# Patient Record
Sex: Female | Born: 1995
Health system: Southern US, Community
[De-identification: ages and names within clinical notes are randomized; demographics above are authoritative.]

## PROBLEM LIST (undated history)

## (undated) DIAGNOSIS — E538 Deficiency of other specified B group vitamins: Secondary | ICD-10-CM

## (undated) DIAGNOSIS — F419 Anxiety disorder, unspecified: Secondary | ICD-10-CM

## (undated) DIAGNOSIS — N83209 Unspecified ovarian cyst, unspecified side: Secondary | ICD-10-CM

## (undated) DIAGNOSIS — E559 Vitamin D deficiency, unspecified: Secondary | ICD-10-CM

## (undated) DIAGNOSIS — Z789 Other specified health status: Secondary | ICD-10-CM

## (undated) HISTORY — DX: Unspecified ovarian cyst, unspecified side: N83.209

## (undated) HISTORY — PX: LIGAMENT REPAIR: SHX5444

## (undated) HISTORY — DX: Deficiency of other specified B group vitamins: E53.8

## (undated) HISTORY — PX: APPENDECTOMY: SHX54

## (undated) HISTORY — DX: Vitamin D deficiency, unspecified: E55.9

## (undated) HISTORY — DX: Anxiety disorder, unspecified: F41.9

---

## 2019-03-25 ENCOUNTER — Inpatient Hospital Stay (HOSPITAL_COMMUNITY)
Admission: AD | Admit: 2019-03-25 | Discharge: 2019-03-25 | Disposition: A | Discharge status: Home / Self Care | Payer: BC Managed Care – PPO | Attending: Obstetrics and Gynecology | Admitting: Obstetrics and Gynecology

## 2019-03-25 ENCOUNTER — Encounter (HOSPITAL_COMMUNITY): Payer: Self-pay

## 2019-03-25 ENCOUNTER — Inpatient Hospital Stay (HOSPITAL_COMMUNITY): Payer: BC Managed Care – PPO

## 2019-03-25 DIAGNOSIS — R109 Unspecified abdominal pain: Secondary | ICD-10-CM | POA: Diagnosis not present

## 2019-03-25 DIAGNOSIS — O99891 Other specified diseases and conditions complicating pregnancy: Secondary | ICD-10-CM | POA: Insufficient documentation

## 2019-03-25 DIAGNOSIS — O4691 Antepartum hemorrhage, unspecified, first trimester: Secondary | ICD-10-CM

## 2019-03-25 DIAGNOSIS — Z3A01 Less than 8 weeks gestation of pregnancy: Secondary | ICD-10-CM | POA: Insufficient documentation

## 2019-03-25 DIAGNOSIS — Z3491 Encounter for supervision of normal pregnancy, unspecified, first trimester: Secondary | ICD-10-CM

## 2019-03-25 DIAGNOSIS — O208 Other hemorrhage in early pregnancy: Secondary | ICD-10-CM | POA: Insufficient documentation

## 2019-03-25 DIAGNOSIS — O468X1 Other antepartum hemorrhage, first trimester: Secondary | ICD-10-CM

## 2019-03-25 DIAGNOSIS — O418X10 Other specified disorders of amniotic fluid and membranes, first trimester, not applicable or unspecified: Secondary | ICD-10-CM

## 2019-03-25 DIAGNOSIS — O4690 Antepartum hemorrhage, unspecified, unspecified trimester: Secondary | ICD-10-CM

## 2019-03-25 HISTORY — DX: Other specified health status: Z78.9

## 2019-03-25 LAB — CBC
HCT: 36.3 % (ref 36.0–46.0)
Hemoglobin: 12.2 g/dL (ref 12.0–15.0)
MCH: 29.2 pg (ref 26.0–34.0)
MCHC: 33.6 g/dL (ref 30.0–36.0)
MCV: 86.8 fL (ref 80.0–100.0)
Platelets: 280 10*3/uL (ref 150–400)
RBC: 4.18 MIL/uL (ref 3.87–5.11)
RDW: 12.2 % (ref 11.5–15.5)
WBC: 11.9 10*3/uL — ABNORMAL HIGH (ref 4.0–10.5)
nRBC: 0 % (ref 0.0–0.2)

## 2019-03-25 LAB — WET PREP, GENITAL
Sperm: NONE SEEN
Trich, Wet Prep: NONE SEEN
Yeast Wet Prep HPF POC: NONE SEEN

## 2019-03-25 LAB — ABO/RH: ABO/RH(D): B POS

## 2019-03-25 LAB — HCG, QUANTITATIVE, PREGNANCY: hCG, Beta Chain, Quant, S: 74349 m[IU]/mL — ABNORMAL HIGH (ref <5)

## 2019-03-25 NOTE — MAU Note (Signed)
Pt was transferred from [place]. She reports bleeding and sharp pain. Has been bleeding since yesterday. Has not gone through whole pad in hour. Her HCG was 247 on November 7th. Was sent here from [facility] in [facility] for repeat quant and U/S.

## 2019-03-25 NOTE — ED Notes (Signed)
Report given to nicole in [facility], transport in ED to take patient over to [facility] at this time

## 2019-03-25 NOTE — Discharge Instructions (Signed)
Subchorionic Hematoma ° °A subchorionic hematoma is a gathering of blood between the outer wall of the embryo (chorion) and the inner wall of the womb (uterus). °This condition can cause vaginal bleeding. If they cause little or no vaginal bleeding, early small hematomas usually shrink on their own and do not affect your baby or pregnancy. When bleeding starts later in pregnancy, or if the hematoma is larger or occurs in older pregnant women, the condition may be more serious. Larger hematomas may get bigger, which increases the chances of miscarriage. This condition also increases the risk of: °· Premature separation of the placenta from the uterus. °· Premature (preterm) labor. °· Stillbirth. °What are the causes? °The exact cause of this condition is not known. It occurs when blood is trapped between the placenta and the uterine wall because the placenta has separated from the original site of implantation. °What increases the risk? °You are more likely to develop this condition if: °· You were treated with fertility medicines. °· You conceived through in vitro fertilization (IVF). °What are the signs or symptoms? °Symptoms of this condition include: °· Vaginal spotting or bleeding. °· Contractions of the uterus. These cause abdominal pain. °Sometimes you may have no symptoms and the bleeding may only be seen when ultrasound images are taken (transvaginal ultrasound). °How is this diagnosed? °This condition is diagnosed based on a physical exam. This includes a pelvic exam. You may also have other tests, including: °· Blood tests. °· Urine tests. °· Ultrasound of the abdomen. °How is this treated? °Treatment for this condition can vary. Treatment may include: °· Watchful waiting. You will be monitored closely for any changes in bleeding. During this stage: °? The hematoma may be reabsorbed by the body. °? The hematoma may separate the fluid-filled space containing the embryo (gestational sac) from the wall of the  womb (endometrium). °· Medicines. °· Activity restriction. This may be needed until the bleeding stops. °Follow these instructions at home: °· Stay on bed rest if told to do so by your health care provider. °· Do not lift anything that is heavier than 10 lbs. (4.5 kg) or as told by your health care provider. °· Do not use any products that contain nicotine or tobacco, such as cigarettes and e-cigarettes. If you need help quitting, ask your health care provider. °· Track and write down the number of pads you use each day and how soaked (saturated) they are. °· Do not use tampons. °· Keep all follow-up visits as told by your health care provider. This is important. Your health care provider may ask you to have follow-up blood tests or ultrasound tests or both. °Contact a health care provider if: °· You have any vaginal bleeding. °· You have a fever. °Get help right away if: °· You have severe cramps in your stomach, back, abdomen, or pelvis. °· You pass large clots or tissue. Save any tissue for your health care provider to look at. °· You have more vaginal bleeding, and you faint or become lightheaded or weak. °Summary °· A subchorionic hematoma is a gathering of blood between the outer wall of the placenta and the uterus. °· This condition can cause vaginal bleeding. °· Sometimes you may have no symptoms and the bleeding may only be seen when ultrasound images are taken. °· Treatment may include watchful waiting, medicines, or activity restriction. °This information is not intended to replace advice given to you by your health care provider. Make sure you discuss any questions you   have with your health care provider. Document Released: 08/03/2006 Document Revised: 03/31/2017 Document Reviewed: 06/14/2016 Elsevier Patient Education  2020 [employer].      Constipation, Adult Constipation is when a person has fewer bowel movements in a week than normal, has difficulty having a bowel movement, or has stools  that are dry, hard, or larger than normal. Constipation may be caused by an underlying condition. It may become worse with age if a person takes certain medicines and does not take in enough fluids. Follow these instructions at home: Eating and drinking   Eat foods that have a lot of fiber, such as fresh fruits and vegetables, whole grains, and beans.  Limit foods that are high in fat, low in fiber, or overly processed, such as french fries, hamburgers, cookies, candies, and soda.  Drink enough fluid to keep your urine clear or pale yellow. General instructions  Exercise regularly or as told by your health care provider.  Go to the restroom when you have the urge to go. Do not hold it in.  Take over-the-counter and prescription medicines only as told by your health care provider. These include any fiber supplements.  Practice pelvic floor retraining exercises, such as deep breathing while relaxing the lower abdomen and pelvic floor relaxation during bowel movements.  Watch your condition for any changes.  Keep all follow-up visits as told by your health care provider. This is important. Contact a health care provider if:  You have pain that gets worse.  You have a fever.  You do not have a bowel movement after 4 days.  You vomit.  You are not hungry.  You lose weight.  You are bleeding from the anus.  You have thin, pencil-like stools. Get help right away if:  You have a fever and your symptoms suddenly get worse.  You leak stool or have blood in your stool.  Your abdomen is bloated.  You have severe pain in your abdomen.  You feel dizzy or you faint. This information is not intended to replace advice given to you by your health care provider. Make sure you discuss any questions you have with your health care provider. Document Released: 01/15/2004 Document Revised: 03/31/2017 Document Reviewed: 10/07/2015 Elsevier Patient Education  2020 [employer].     Safe Medications in Pregnancy   Acne: Benzoyl Peroxide Salicylic Acid  Backache/Headache: Tylenol: 2 regular strength every 4 hours OR              2 Extra strength every 6 hours  Colds/Coughs/Allergies: Benadryl (alcohol free) 25 mg every 6 hours as needed Breath right strips Claritin Cepacol throat lozenges Chloraseptic throat spray Cold-Eeze- up to three times per day Cough drops, alcohol free Flonase (by prescription only) Guaifenesin Mucinex Robitussin DM (plain only, alcohol free) Saline nasal spray/drops Sudafed (pseudoephedrine) & Actifed ** use only after [redacted] weeks gestation and if you do not have high blood pressure Tylenol Vicks Vaporub Zinc lozenges Zyrtec   Constipation: Colace Ducolax suppositories Fleet enema Glycerin suppositories Metamucil Milk of magnesia Miralax Senokot Smooth move tea  Diarrhea: Kaopectate Imodium A-D  *NO pepto Bismol  Hemorrhoids: Anusol Anusol HC Preparation H Tucks  Indigestion: Tums Maalox Mylanta Zantac  Pepcid  Insomnia: Benadryl (alcohol free) 25mg  every 6 hours as needed Tylenol PM Unisom, no Gelcaps  Leg Cramps: Tums MagGel  Nausea/Vomiting:  Bonine Dramamine Emetrol Ginger extract Sea bands Meclizine  Nausea medication to take during pregnancy:  Unisom (doxylamine succinate 25 mg tablets) Take one  tablet daily at bedtime. If symptoms are not adequately controlled, the dose can be increased to a maximum recommended dose of two tablets daily (1/2 tablet in the morning, 1/2 tablet mid-afternoon and one at bedtime). Vitamin B6 100mg  tablets. Take one tablet twice a day (up to 200 mg per day).  Skin Rashes: Aveeno products Benadryl cream or 25mg  every 6 hours as needed Calamine Lotion 1% cortisone cream  Yeast infection: Gyne-lotrimin 7 Monistat 7  Gum/tooth pain: Anbesol  **If taking multiple medications, please check labels to avoid duplicating the same active  ingredients **take medication as directed on the label ** Do not exceed 4000 mg of tylenol in 24 hours **Do not take medications that contain aspirin or ibuprofen

## 2019-03-25 NOTE — MAU Provider Note (Signed)
Chief Complaint: Vaginal Bleeding and Abdominal Pain   First Provider Initiated Contact with Patient 03/25/19 1548     SUBJECTIVE HPI: Whitney Castro is a 23 y.o. G1P0 at [redacted]w[redacted]d who presents to Maternity Admissions reporting vaginal bleeding and abdominal pain. Lives in [place]. Had an HCG on 11/7 that was 247 & is scheduled for follow up ultrasound on Wednesday (per patient & Care Everywhere).  Current symptoms started last night. Reports sharp pain & pressure in her RLQ. Feels pressure in her rectum like she needs to have a BM. Had a BM last night but symptoms didn't improve. Is not taking anything for constipation. Started having some vaginal bleeding last night after her BM. Bleeding was bright red but is now dark red/pink & has slowed down. Not saturating pads or passing clots. Some n/v. Denies fever/chills.   Location: RLQ abdomen Quality: sharp, pressure Severity: 3/10 on pain scale Duration: 1 day Timing: constant Modifying factors: worse with touch Associated signs and symptoms: vaginal bleeding, constipation  Past Medical History:  Diagnosis Date  . Medical history non-contributory    OB History  Gravida Para Term Preterm AB Living  1            SAB TAB Ectopic Multiple Live Births               # Outcome Date GA Lbr Len/2nd Weight Sex Delivery Anes PTL Lv  1 Current            Past Surgical History:  Procedure Laterality Date  . APPENDECTOMY    . LIGAMENT REPAIR     knee and elbow   Social History   Socioeconomic History  . Marital status: Single    Spouse name: Not on file  . Number of children: Not on file  . Years of education: Not on file  . Highest education level: Not on file  Occupational History  . Not on file  Social Needs  . Financial resource strain: Not on file  . Food insecurity    Worry: Not on file    Inability: Not on file  . Transportation needs    Medical: Not on file    Non-medical: Not on file  Tobacco Use  . Smoking status: Never  Smoker  . Smokeless tobacco: Never Used  Substance and Sexual Activity  . Alcohol use: Not Currently  . Drug use: Never  . Sexual activity: Not on file  Lifestyle  . Physical activity    Days per week: Not on file    Minutes per session: Not on file  . Stress: Not on file  Relationships  . Social Herbalist on phone: Not on file    Gets together: Not on file    Attends religious service: Not on file    Active member of club or organization: Not on file    Attends meetings of clubs or organizations: Not on file    Relationship status: Not on file  . Intimate partner violence    Fear of current or ex partner: Not on file    Emotionally abused: Not on file    Physically abused: Not on file    Forced sexual activity: Not on file  Other Topics Concern  . Not on file  Social History Narrative  . Not on file   History reviewed. No pertinent family history. No current facility-administered medications on file prior to encounter.    No current outpatient medications on file prior to encounter.  No Known Allergies  I have reviewed patient's Past Medical Hx, Surgical Hx, Family Hx, Social Hx, medications and allergies.   Review of Systems  Constitutional: Negative.   Gastrointestinal: Positive for abdominal pain, constipation, nausea, rectal pain and vomiting. Negative for abdominal distention, blood in stool and diarrhea.  Genitourinary: Positive for vaginal bleeding. Negative for vaginal discharge.    OBJECTIVE Patient Vitals for the past 24 hrs:  BP Temp Temp src Pulse Resp SpO2 Height Weight  03/25/19 1522 116/61 98.1 F (36.7 C) Oral 84 18 100 % 5\' 5"  (1.651 m) 76.4 kg  03/25/19 1415 125/63 98.6 F (37 C) Oral 81 17 99 % - -   Constitutional: Well-developed, well-nourished female in no acute distress.  Cardiovascular: normal rate & rhythm, no murmur Respiratory: normal rate and effort. Lung sounds clear throughout GI: Abd soft, non-tender, Pos BS x 4. No  guarding or rebound tenderness MS: Extremities nontender, no edema, normal ROM Neurologic: Alert and oriented x 4.  GU:     SPECULUM EXAM: NEFG, small amount of dark pink blood. No active bleeding.   BIMANUAL: No CMT. cervix closed; uterus normal size, right adnexal tenderness  LAB RESULTS Results for orders placed or performed during the hospital encounter of 03/25/19 (from the past 24 hour(s))  CBC     Status: Abnormal   Collection Time: 03/25/19  3:31 PM  Result Value Ref Range   WBC 11.9 (H) 4.0 - 10.5 K/uL   RBC 4.18 3.87 - 5.11 MIL/uL   Hemoglobin 12.2 12.0 - 15.0 g/dL   HCT 09.836.3 11.936.0 - 14.746.0 %   MCV 86.8 80.0 - 100.0 fL   MCH 29.2 26.0 - 34.0 pg   MCHC 33.6 30.0 - 36.0 g/dL   RDW 82.912.2 56.211.5 - 13.015.5 %   Platelets 280 150 - 400 K/uL   nRBC 0.0 0.0 - 0.2 %  hCG, quantitative, pregnancy     Status: Abnormal   Collection Time: 03/25/19  3:31 PM  Result Value Ref Range   hCG, Beta Chain, Quant, S 74,349 (H) <5 mIU/mL  ABO/Rh     Status: None   Collection Time: 03/25/19  3:31 PM  Result Value Ref Range   ABO/RH(D) B POS    No rh immune globuloin      NOT A RH IMMUNE GLOBULIN CANDIDATE, PT RH POSITIVE Performed at [facility], [address] [place], [place] [address]   Wet prep, genital     Status: Abnormal   Collection Time: 03/25/19  4:00 PM   Specimen: PATH Cytology Cervicovaginal Ancillary Only  Result Value Ref Range   Yeast Wet Prep HPF POC NONE SEEN NONE SEEN   Trich, Wet Prep NONE SEEN NONE SEEN   Clue Cells Wet Prep HPF POC PRESENT (A) NONE SEEN   WBC, Wet Prep HPF POC MODERATE (A) NONE SEEN   Sperm NONE SEEN     IMAGING Kore[place] Ob Less Than 14 Weeks With Ob Transvaginal  Result Date: 03/25/2019 CLINICAL DATA:  Abnormal vaginal bleeding.  Early pregnancy. EXAM: OBSTETRIC <14 WK ULTRASOUND TECHNIQUE: Transabdominal ultrasound was performed for evaluation of the gestation as well as the maternal uterus and adnexal regions. COMPARISON:  None. FINDINGS:  Intrauterine gestational sac: Single Yolk sac:  Yes Embryo:  Yes Cardiac Activity: Yes Heart Rate: 121 bpm CRL:   7.1 mm   6 w 4 d                  US EDC:  11/14/2019 Subchorionic hemorrhage:  Large subchorionic hemorrhage. Maternal uterus/adnexae: Normal ovaries.  No free fluid. IMPRESSION: Large subchorionic hemorrhage. Intrauterine pregnancy of approximately 6 weeks 4 days gestation. Electronically Signed   By: Francene Boyers M.D.   On: 03/25/2019 18:43    MAU COURSE Orders Placed This Encounter  Procedures  . Wet prep, genital  . US OB LESS THAN 14 WEEKS WITH OB TRANSVAGINAL  . CBC  . hCG, quantitative, pregnancy  . ABO/Rh  . Discharge patient   No orders of the defined types were placed in this encounter.   MDM +UPT UA, wet prep, GC/chlamydia, CBC, ABO/Rh, quant hCG, and [place] today to rule out ectopic pregnancy which is life threatening.   RH positive  Ultrasound shows live IUP with a large SCH. EDD updated.   ASSESSMENT 1. Subchorionic hematoma in first trimester, single or unspecified fetus   2. Vaginal bleeding in pregnancy   3. Normal IUP (intrauterine pregnancy) on prenatal ultrasound, first trimester     PLAN Discharge home in stable condition. Bleeding precautions Written info for constipation treatment given F/u with OB in [place]  Follow-up Information    [facility] Follow up.   Specialty: Obstetrics and Gynecology Why: return for worsening symptoms Contact information: [id number] [place] [address] [phone]         Allergies as of 03/25/2019   No Known Allergies     Medication List    You have not been prescribed any medications.      Judeth Horn, NP 03/25/2019  6:56 PM

## 2019-03-26 LAB — GC/CHLAMYDIA PROBE AMP (~~LOC~~) NOT AT ARMC
Chlamydia: NEGATIVE
Comment: NEGATIVE
Comment: NORMAL
Neisseria Gonorrhea: NEGATIVE

## 2019-11-05 ENCOUNTER — Other Ambulatory Visit: Payer: Self-pay

## 2019-11-05 ENCOUNTER — Ambulatory Visit (INDEPENDENT_AMBULATORY_CARE_PROVIDER_SITE_OTHER): Payer: Self-pay | Admitting: Pediatrics

## 2019-11-05 DIAGNOSIS — Z7681 Expectant parent(s) prebirth pediatrician visit: Secondary | ICD-10-CM

## 2019-11-05 NOTE — Progress Notes (Signed)
Prenatal counseling for impending newborn done. Prenatal care started at 6 weeks. Small VSD found at 20 weeks, rechecked at 28 weeks by ECHO and VSD resolved. Mom is GBS+, no other concerns.

## 2019-11-06 LAB — OB RESULTS CONSOLE HIV ANTIBODY (ROUTINE TESTING): HIV: NONREACTIVE

## 2019-11-06 LAB — OB RESULTS CONSOLE GC/CHLAMYDIA
Chlamydia: NEGATIVE
Gonorrhea: NEGATIVE

## 2019-11-06 LAB — OB RESULTS CONSOLE ABO/RH: RH Type: POSITIVE

## 2019-11-06 LAB — OB RESULTS CONSOLE RPR: RPR: NONREACTIVE

## 2019-11-06 LAB — OB RESULTS CONSOLE HEPATITIS B SURFACE ANTIGEN: Hepatitis B Surface Ag: NEGATIVE

## 2019-11-06 LAB — OB RESULTS CONSOLE ANTIBODY SCREEN: Antibody Screen: NEGATIVE

## 2019-11-06 LAB — OB RESULTS CONSOLE RUBELLA ANTIBODY, IGM: Rubella: IMMUNE

## 2019-11-06 LAB — OB RESULTS CONSOLE GBS: GBS: POSITIVE

## 2019-11-10 ENCOUNTER — Inpatient Hospital Stay (HOSPITAL_COMMUNITY)
Admission: AD | Admit: 2019-11-10 | Discharge: 2019-11-10 | Disposition: A | Discharge status: Home / Self Care | Payer: BC Managed Care – PPO | Attending: Obstetrics | Admitting: Obstetrics

## 2019-11-10 ENCOUNTER — Encounter (HOSPITAL_COMMUNITY): Payer: Self-pay | Admitting: Obstetrics and Gynecology

## 2019-11-10 ENCOUNTER — Other Ambulatory Visit: Payer: Self-pay

## 2019-11-10 DIAGNOSIS — Z3A40 40 weeks gestation of pregnancy: Secondary | ICD-10-CM | POA: Diagnosis not present

## 2019-11-10 DIAGNOSIS — Z3A39 39 weeks gestation of pregnancy: Secondary | ICD-10-CM

## 2019-11-10 DIAGNOSIS — O471 False labor at or after 37 completed weeks of gestation: Secondary | ICD-10-CM | POA: Diagnosis present

## 2019-11-10 DIAGNOSIS — Z3689 Encounter for other specified antenatal screening: Secondary | ICD-10-CM | POA: Diagnosis not present

## 2019-11-10 DIAGNOSIS — O480 Post-term pregnancy: Secondary | ICD-10-CM | POA: Insufficient documentation

## 2019-11-10 DIAGNOSIS — O479 False labor, unspecified: Secondary | ICD-10-CM

## 2019-11-10 NOTE — MAU Note (Signed)
pt reports she started having back pain around 7pm. Got stronger around 11:30pm took some tylenol no relief pain got sharper Denies any vag bleeding or leaking at this time. No dilation and last office visit.  fetal movement less since ctx started.

## 2019-11-10 NOTE — Discharge Instructions (Signed)
Braxton Hicks Contractions °Contractions of the uterus can occur throughout pregnancy, but they are not always a sign that you are in labor. You may have practice contractions called Braxton Hicks contractions. These false labor contractions are sometimes confused with true labor. °What are Braxton Hicks contractions? °Braxton Hicks contractions are tightening movements that occur in the muscles of the uterus before labor. Unlike true labor contractions, these contractions do not result in opening (dilation) and thinning of the cervix. Toward the end of pregnancy (32-34 weeks), Braxton Hicks contractions can happen more often and may become stronger. These contractions are sometimes difficult to tell apart from true labor because they can be very uncomfortable. You should not feel embarrassed if you go to the hospital with false labor. °Sometimes, the only way to tell if you are in true labor is for your health care provider to look for changes in the cervix. The health care provider will do a physical exam and may monitor your contractions. If you are not in true labor, the exam should show that your cervix is not dilating and your water has not broken. °If there are no other health problems associated with your pregnancy, it is completely safe for you to be sent home with false labor. You may continue to have Braxton Hicks contractions until you go into true labor. °How to tell the difference between true labor and false labor °True labor °· Contractions last 30-70 seconds. °· Contractions become very regular. °· Discomfort is usually felt in the top of the uterus, and it spreads to the lower abdomen and low back. °· Contractions do not go away with walking. °· Contractions usually become more intense and increase in frequency. °· The cervix dilates and gets thinner. °False labor °· Contractions are usually shorter and not as strong as true labor contractions. °· Contractions are usually irregular. °· Contractions  are often felt in the front of the lower abdomen and in the groin. °· Contractions may go away when you walk around or change positions while lying down. °· Contractions get weaker and are shorter-lasting as time goes on. °· The cervix usually does not dilate or become thin. °Follow these instructions at home: ° °· Take over-the-counter and prescription medicines only as told by your health care provider. °· Keep up with your usual exercises and follow other instructions from your health care provider. °· Eat and drink lightly if you think you are going into labor. °· If Braxton Hicks contractions are making you uncomfortable: °? Change your position from lying down or resting to walking, or change from walking to resting. °? Sit and rest in a tub of warm water. °? Drink enough fluid to keep your urine pale yellow. Dehydration may cause these contractions. °? Do slow and deep breathing several times an hour. °· Keep all follow-up prenatal visits as told by your health care provider. This is important. °Contact a health care provider if: °· You have a fever. °· You have continuous pain in your abdomen. °Get help right away if: °· Your contractions become stronger, more regular, and closer together. °· You have fluid leaking or gushing from your vagina. °· You pass blood-tinged mucus (bloody show). °· You have bleeding from your vagina. °· You have low back pain that you never had before. °· You feel your baby’s head pushing down and causing pelvic pressure. °· Your baby is not moving inside you as much as it used to. °Summary °· Contractions that occur before labor are   called Braxton Hicks contractions, false labor, or practice contractions. °· Braxton Hicks contractions are usually shorter, weaker, farther apart, and less regular than true labor contractions. True labor contractions usually become progressively stronger and regular, and they become more frequent. °· Manage discomfort from Braxton Hicks contractions  by changing position, resting in a warm bath, drinking plenty of water, or practicing deep breathing. °This information is not intended to replace advice given to you by your health care provider. Make sure you discuss any questions you have with your health care provider. °Document Revised: 03/31/2017 Document Reviewed: 09/01/2016 °Elsevier Patient Education © 2020 [employer] ° °

## 2019-11-10 NOTE — MAU Note (Addendum)
I have communicated with Venia Carbon, NP and reviewed vital signs:  Vitals:   11/10/19 0204 11/10/19 0344  BP: 124/76 119/69  Pulse: 73 85  Resp: 18   Temp: 98.3 F (36.8 C)     Vaginal exam:  Dilation: Closed Effacement (%): Thick Cervical Position: Posterior Presentation: Undeterminable Exam by:: Manuela Neptune, RN,   Also reviewed contraction pattern and that non-stress test is reactive.  It has been documented that patient is contracting irregularly with no cervical change over 1 hour not indicating active labor. Patient states contractions have significantly improved since she has been in MAU.  Patient denies any other complaints.  Based on this report provider has given order for discharge.  A discharge order and diagnosis entered by a provider.   Labor discharge instructions reviewed with patient.

## 2019-11-12 ENCOUNTER — Encounter (HOSPITAL_COMMUNITY): Payer: Self-pay | Admitting: *Deleted

## 2019-11-12 ENCOUNTER — Telehealth (HOSPITAL_COMMUNITY): Payer: Self-pay | Admitting: *Deleted

## 2019-11-12 NOTE — Telephone Encounter (Signed)
Preadmission screen  

## 2019-11-13 ENCOUNTER — Encounter (HOSPITAL_COMMUNITY): Payer: Self-pay | Admitting: *Deleted

## 2019-11-15 ENCOUNTER — Encounter (HOSPITAL_COMMUNITY): Payer: Self-pay | Admitting: Obstetrics and Gynecology

## 2019-11-15 ENCOUNTER — Inpatient Hospital Stay (EMERGENCY_DEPARTMENT_HOSPITAL)
Admission: AD | Admit: 2019-11-15 | Discharge: 2019-11-15 | Disposition: A | Discharge status: Home / Self Care | Payer: BC Managed Care – PPO | Source: Home / Self Care | Attending: Obstetrics and Gynecology | Admitting: Obstetrics and Gynecology

## 2019-11-15 ENCOUNTER — Other Ambulatory Visit: Payer: Self-pay

## 2019-11-15 DIAGNOSIS — O480 Post-term pregnancy: Secondary | ICD-10-CM | POA: Insufficient documentation

## 2019-11-15 DIAGNOSIS — B373 Candidiasis of vulva and vagina: Secondary | ICD-10-CM

## 2019-11-15 DIAGNOSIS — Z3689 Encounter for other specified antenatal screening: Secondary | ICD-10-CM

## 2019-11-15 DIAGNOSIS — Z3A40 40 weeks gestation of pregnancy: Secondary | ICD-10-CM | POA: Insufficient documentation

## 2019-11-15 DIAGNOSIS — Z0371 Encounter for suspected problem with amniotic cavity and membrane ruled out: Secondary | ICD-10-CM

## 2019-11-15 DIAGNOSIS — O98819 Other maternal infectious and parasitic diseases complicating pregnancy, unspecified trimester: Secondary | ICD-10-CM

## 2019-11-15 DIAGNOSIS — O99891 Other specified diseases and conditions complicating pregnancy: Secondary | ICD-10-CM | POA: Diagnosis not present

## 2019-11-15 LAB — WET PREP, GENITAL
Clue Cells Wet Prep HPF POC: NONE SEEN
Sperm: NONE SEEN
Trich, Wet Prep: NONE SEEN

## 2019-11-15 LAB — POCT FERN TEST: POCT Fern Test: NEGATIVE

## 2019-11-15 MED ORDER — TERCONAZOLE 0.8 % VA CREA: 1.0000 | TOPICAL_CREAM | Freq: Every day | VAGINAL | 0 refills | Status: DC

## 2019-11-15 NOTE — Discharge Instructions (Signed)

## 2019-11-15 NOTE — MAU Note (Signed)
PT SAYS AT 0400 TODAY- OUTSIDE WALKING DOG- FELT LARGE GUSH OF WATER- - CONTINUED THROUGH THE DAY . [facility] - WITH DR  Ernestina Penna -  VE - Thursday - CLOSED- VERTEX.  DENIES HSV AND MRSA.  GBS- POSITIVE

## 2019-11-15 NOTE — MAU Provider Note (Signed)
S: Ms. Whitney Castro is a 24 y.o. G1P0 at [redacted]w[redacted]d  who presents to MAU today complaining of leaking of fluid since 0400. She states that she had a large gush of fluid at 0400 and has since had to change her underwear 5x today.  Patient denies vaginal bleeding. She denies contractions. She reports normal fetal movement.    O: BP 123/76 (BP Location: Right Arm)   Pulse 87   Temp 98.5 F (36.9 C) (Oral)   Resp 20   Ht 5\' 4"  (1.626 m)   Wt 88.2 kg   LMP 02/13/2019   BMI 33.39 kg/m  GENERAL: Well-developed, well-nourished female in no acute distress.  HEAD: Normocephalic, atraumatic.  CHEST: Normal effort of breathing, regular heart rate ABDOMEN: Soft, nontender, gravid PELVIC: Normal external female genitalia. Vagina is pink and rugated. Curdy white watery discharge.  Scant pooling that is milky in color-likely d/t yeast. Cervix pink appears closed.  Questionable polp noted. No active leaking or leakage with valsalva maneuver. Fern Collected.  Cervical exam:   Deferred   Fetal Monitoring: FHT: 125 bpm, Mod Var, -Decels, +Accels Toco: Occasional  Results for orders placed or performed during the hospital encounter of 11/15/19 (from the past 24 hour(s))  Wet prep, genital     Status: Abnormal   Collection Time: 11/15/19  9:26 PM   Specimen: Vaginal  Result Value Ref Range   Yeast Wet Prep HPF POC PRESENT (A) NONE SEEN   Trich, Wet Prep NONE SEEN NONE SEEN   Clue Cells Wet Prep HPF POC NONE SEEN NONE SEEN   WBC, Wet Prep HPF POC MANY (A) NONE SEEN   Sperm NONE SEEN      A: SIUP at [redacted]w[redacted]d  Membranes intact  NST Reactive  P:  -Patient informed of vaginal exam findings and informed suspicious for yeast. -Fern collected, reviewed negative. -Wet prep collected and pending. -Will await results.    [redacted]w[redacted]d, CNM 11/15/2019 9:28 PM   Reassessment (10:18 PM) Candidiasis of vagina  -WP returns positive for yeast. -Patient informed of results and treatment of Terazol. -Patient  questions why she can't have Diflucan and informed that this provider does not prescribe that during pregnancy and prefers treatment at source. -Patient agreeable and without further questions or concerns. -Encouraged to call or return to MAU if symptoms worsen or with the onset of new symptoms. -Discharged to home in stable condition.  11/17/2019 MSN, CNM Advanced Practice Provider[facility]

## 2019-11-17 ENCOUNTER — Inpatient Hospital Stay (HOSPITAL_COMMUNITY)
Admission: AD | Admit: 2019-11-17 | Discharge: 2019-11-20 | DRG: 807 | Disposition: A | Discharge status: Home / Self Care | Payer: BC Managed Care – PPO | Attending: Obstetrics and Gynecology | Admitting: Obstetrics and Gynecology

## 2019-11-17 ENCOUNTER — Inpatient Hospital Stay (HOSPITAL_COMMUNITY): Payer: BC Managed Care – PPO | Admitting: Anesthesiology

## 2019-11-17 ENCOUNTER — Other Ambulatory Visit: Payer: Self-pay

## 2019-11-17 ENCOUNTER — Encounter (HOSPITAL_COMMUNITY): Payer: Self-pay | Admitting: Obstetrics and Gynecology

## 2019-11-17 DIAGNOSIS — O26893 Other specified pregnancy related conditions, third trimester: Secondary | ICD-10-CM | POA: Diagnosis present

## 2019-11-17 DIAGNOSIS — F419 Anxiety disorder, unspecified: Secondary | ICD-10-CM | POA: Diagnosis present

## 2019-11-17 DIAGNOSIS — Z3A40 40 weeks gestation of pregnancy: Secondary | ICD-10-CM | POA: Diagnosis not present

## 2019-11-17 DIAGNOSIS — O99824 Streptococcus B carrier state complicating childbirth: Secondary | ICD-10-CM | POA: Diagnosis present

## 2019-11-17 DIAGNOSIS — F329 Major depressive disorder, single episode, unspecified: Secondary | ICD-10-CM | POA: Diagnosis present

## 2019-11-17 DIAGNOSIS — O99344 Other mental disorders complicating childbirth: Secondary | ICD-10-CM | POA: Diagnosis present

## 2019-11-17 DIAGNOSIS — Z20822 Contact with and (suspected) exposure to covid-19: Secondary | ICD-10-CM | POA: Diagnosis present

## 2019-11-17 LAB — CBC
HCT: 38.2 % (ref 36.0–46.0)
Hemoglobin: 12.6 g/dL (ref 12.0–15.0)
MCH: 29.7 pg (ref 26.0–34.0)
MCHC: 33 g/dL (ref 30.0–36.0)
MCV: 90.1 fL (ref 80.0–100.0)
Platelets: 224 10*3/uL (ref 150–400)
RBC: 4.24 MIL/uL (ref 3.87–5.11)
RDW: 13.6 % (ref 11.5–15.5)
WBC: 13.3 10*3/uL — ABNORMAL HIGH (ref 4.0–10.5)
nRBC: 0 % (ref 0.0–0.2)

## 2019-11-17 LAB — TYPE AND SCREEN
ABO/RH(D): B POS
Antibody Screen: NEGATIVE

## 2019-11-17 LAB — SARS CORONAVIRUS 2 BY RT PCR (HOSPITAL ORDER, PERFORMED IN ~~LOC~~ HOSPITAL LAB): SARS Coronavirus 2: NEGATIVE

## 2019-11-17 LAB — POCT FERN TEST: POCT Fern Test: NEGATIVE

## 2019-11-17 MED ORDER — SOD CITRATE-CITRIC ACID 500-334 MG/5ML PO SOLN: 30.0000 mL | ORAL | Status: DC | PRN

## 2019-11-17 MED ORDER — FENTANYL CITRATE (PF) 100 MCG/2ML IJ SOLN
INTRAMUSCULAR | Status: AC
  Filled 2019-11-17: qty 2

## 2019-11-17 MED ORDER — BUTORPHANOL TARTRATE 1 MG/ML IJ SOLN
INTRAMUSCULAR | Status: AC
  Filled 2019-11-17: qty 2

## 2019-11-17 MED ORDER — BUTORPHANOL TARTRATE 1 MG/ML IJ SOLN
2.0000 mg | INTRAMUSCULAR | Status: DC | PRN
  Administered 2019-11-17: 2 mg via INTRAVENOUS

## 2019-11-17 MED ORDER — ONDANSETRON HCL 4 MG/2ML IJ SOLN
4.0000 mg | Freq: Four times a day (QID) | INTRAMUSCULAR | Status: DC | PRN
  Administered 2019-11-17: 4 mg via INTRAVENOUS
  Filled 2019-11-17: qty 2

## 2019-11-17 MED ORDER — SODIUM CHLORIDE (PF) 0.9 % IJ SOLN
INTRAMUSCULAR | Status: DC | PRN
  Administered 2019-11-17: 12 mL/h via EPIDURAL

## 2019-11-17 MED ORDER — OXYCODONE-ACETAMINOPHEN 5-325 MG PO TABS: 2.0000 | ORAL_TABLET | ORAL | Status: DC | PRN

## 2019-11-17 MED ORDER — LIDOCAINE HCL (PF) 1 % IJ SOLN: 30.0000 mL | INTRAMUSCULAR | Status: DC | PRN

## 2019-11-17 MED ORDER — LACTATED RINGERS IV SOLN
500.0000 mL | INTRAVENOUS | Status: DC | PRN
  Administered 2019-11-18: 500 mL via INTRAVENOUS

## 2019-11-17 MED ORDER — ACETAMINOPHEN 325 MG PO TABS: 650.0000 mg | ORAL_TABLET | ORAL | Status: DC | PRN

## 2019-11-17 MED ORDER — FENTANYL CITRATE (PF) 100 MCG/2ML IJ SOLN: 100.0000 ug | Freq: Once | INTRAMUSCULAR | Status: DC

## 2019-11-17 MED ORDER — PHENYLEPHRINE 40 MCG/ML (10ML) SYRINGE FOR IV PUSH (FOR BLOOD PRESSURE SUPPORT): 80.0000 ug | PREFILLED_SYRINGE | INTRAVENOUS | Status: DC | PRN

## 2019-11-17 MED ORDER — OXYTOCIN-SODIUM CHLORIDE 30-0.9 UT/500ML-% IV SOLN
2.5000 [IU]/h | INTRAVENOUS | Status: DC
  Administered 2019-11-18: 2.5 [IU]/h via INTRAVENOUS
  Filled 2019-11-17: qty 500

## 2019-11-17 MED ORDER — OXYCODONE-ACETAMINOPHEN 5-325 MG PO TABS: 1.0000 | ORAL_TABLET | ORAL | Status: DC | PRN

## 2019-11-17 MED ORDER — DIPHENHYDRAMINE HCL 50 MG/ML IJ SOLN: 12.5000 mg | INTRAMUSCULAR | Status: DC | PRN

## 2019-11-17 MED ORDER — PHENYLEPHRINE 40 MCG/ML (10ML) SYRINGE FOR IV PUSH (FOR BLOOD PRESSURE SUPPORT)
80.0000 ug | PREFILLED_SYRINGE | INTRAVENOUS | Status: DC | PRN
  Filled 2019-11-17: qty 10

## 2019-11-17 MED ORDER — EPHEDRINE 5 MG/ML INJ: 10.0000 mg | INTRAVENOUS | Status: DC | PRN

## 2019-11-17 MED ORDER — OXYTOCIN BOLUS FROM INFUSION
333.0000 mL | Freq: Once | INTRAVENOUS | Status: AC
  Administered 2019-11-18: 333 mL via INTRAVENOUS

## 2019-11-17 MED ORDER — LACTATED RINGERS IV SOLN: INTRAVENOUS | Status: DC

## 2019-11-17 MED ORDER — LACTATED RINGERS IV SOLN
500.0000 mL | Freq: Once | INTRAVENOUS | Status: AC
  Administered 2019-11-17: 500 mL via INTRAVENOUS

## 2019-11-17 MED ORDER — FLEET ENEMA 7-19 GM/118ML RE ENEM: 1.0000 | ENEMA | RECTAL | Status: DC | PRN

## 2019-11-17 MED ORDER — SODIUM CHLORIDE 0.9 % IV SOLN
5.0000 10*6.[IU] | Freq: Once | INTRAVENOUS | Status: AC
  Administered 2019-11-17: 5 10*6.[IU] via INTRAVENOUS
  Filled 2019-11-17: qty 5

## 2019-11-17 MED ORDER — LIDOCAINE HCL (PF) 1 % IJ SOLN
INTRAMUSCULAR | Status: DC | PRN
  Administered 2019-11-17: 12 mL via EPIDURAL

## 2019-11-17 MED ORDER — PENICILLIN G POT IN DEXTROSE 60000 UNIT/ML IV SOLN
3.0000 10*6.[IU] | INTRAVENOUS | Status: DC
  Administered 2019-11-17: 3 10*6.[IU] via INTRAVENOUS
  Filled 2019-11-17 (×2): qty 50

## 2019-11-17 MED ORDER — FENTANYL-BUPIVACAINE-NACL 0.5-0.125-0.9 MG/250ML-% EP SOLN
12.0000 mL/h | EPIDURAL | Status: DC | PRN
  Filled 2019-11-17: qty 250

## 2019-11-17 NOTE — H&P (Signed)
Whitney Castro is a 24 y.o. G1P0 at [redacted]w[redacted]d gestation presents for complaint of Contractions, regular and painful.  Also noted leaking but fern negative.  +FM; ctx more uncomfortable then when she first got here, no more leaking fluid  Antepartum course: S>d, efw 84% (8'7"); vsd noted then resolved at 28 wga; anxiety/depression - zoloft, stable [facility] since 38.6 wks, transfer of care  See complete pre-natal records  History OB History    Gravida  1   Para      Term      Preterm      AB      Living        SAB      TAB      Ectopic      Multiple      Live Births             Past Medical History:  Diagnosis Date  . Anxiety   . B12 deficiency   . Medical history non-contributory   . Ovarian cyst   . Vitamin D deficiency    Past Surgical History:  Procedure Laterality Date  . APPENDECTOMY    . LIGAMENT REPAIR     knee and elbow   Family History: family history is not on file. Social History:  reports that she has never smoked. She has never used smokeless tobacco. She reports previous alcohol use. She reports that she does not use drugs.  ROS: See above otherwise negative  Prenatal labs:  ABO, Rh: --/--/B POS (07/18 1745) Antibody: NEG (07/18 1745) Rubella: Immune (07/07 0000) RPR: Nonreactive (07/07 0000)  HBsAg: Negative (07/07 0000)  HIV:Non-reactive (07/07 0000)  GBS: Positive/-- (07/07 0000)  1 hr Glucola: Normal Genetic screening: Normal Anatomy US: vsd noted  Physical Exam:   Dilation: 1 Effacement (%): 50, 60 Station: -2 Exam by:: Marvel Plan RN  Blood pressure 117/66, pulse 73, temperature 98.3 F (36.8 C), temperature source Oral, resp. rate 16, last menstrual period 02/13/2019, SpO2 99 %. A&O x 3 HEENT: Normal Lungs: CTAB CV: RRR Abdominal: Soft, Non-tender, Gravid and Estimated fetal weight: 8 lbs - 8 1/2 lbs Lower Extremities: Non-edematous, Non-tender  Pelvic Exam:      Dilatation: 1cm-2cm     Effacement:  60%     Station: -3     Presentation: Cephalic, bag of membranes noted, too high for arom  Labs:  CBC:  Lab Results  Component Value Date   WBC 13.3 (H) 11/17/2019   RBC 4.24 11/17/2019   HGB 12.6 11/17/2019   HCT 38.2 11/17/2019   MCV 90.1 11/17/2019   MCH 29.7 11/17/2019   MCHC 33.0 11/17/2019   RDW 13.6 11/17/2019   PLT 224 11/17/2019   CMP: No results found for: NA, K, CL, CO2, GLUCOSE, BUN, CREATININE, CALCIUM, PROT, AST, ALT, ALBUMIN, ALKPHOS, BILITOT, GFRNONAA, GFRAA, ANIONGAP Urine: No results found for: COLORURINE, APPEARANCEUR, LABSPEC, PHURINE, GLUCOSEU, HGBUR, BILIRUBINUR, KETONESUR, PROTEINUR, NITRITE, LEUKOCYTESUR   Prenatal Transfer Tool  Maternal Diabetes: No Genetic Screening: Normal Maternal Ultrasounds/Referrals: Cardiac defect Fetal Ultrasounds or other Referrals:  Fetal echo - vsd noted, repeat at 28 wga wnl Maternal Substance Abuse:  none Significant Maternal Medications:  Meds include: Zoloft Significant Maternal Lab Results: Group B Strep positive  FHT: 130s, nml variability Toco: q 3-5 min  Assessment/Plan:  24 y.o. G1P0 at [redacted]w[redacted]d gestation   1. Labor - admit and plan svd; planning epidural, s/p 1 dose iv stadol had relief but wearing off, plan second  dose and will then likely be ready for epidural   2.   H/o fetal vsd, resolved; plan eval after delivery; FOB has child with h/o asd 3.  gbs pos - pcn for prophylaxis 4.  Depression/anxiety - zoloft, stable 5. Rh pos 6. RI 7. S>D, u/s with efw 8'7" (83%)  Vick Frees 11/17/2019, 7:33 PM

## 2019-11-17 NOTE — MAU Note (Signed)
Pt reports to mau with c/o ctx q 3 minutes since this morning.  Pt reports trickling of fluid with ctx upon arriving in mau. +FM

## 2019-11-17 NOTE — Anesthesia Procedure Notes (Addendum)
Epidural Patient location during procedure: OB Start time: 11/17/2019 10:05 PM End time: 11/17/2019 10:20 PM  Staffing Anesthesiologist: Elmer Picker, MD Performed: anesthesiologist   Preanesthetic Checklist Completed: patient identified, IV checked, risks and benefits discussed, monitors and equipment checked, pre-op evaluation and timeout performed  Epidural Patient position: sitting Prep: DuraPrep and site prepped and draped Patient monitoring: continuous pulse ox, blood pressure, heart rate and cardiac monitor Approach: midline Location: L3-L4 Injection technique: LOR air  Needle:  Needle type: Tuohy  Needle gauge: 17 G Needle length: 9 cm Needle insertion depth: 6 cm Catheter type: closed end flexible Catheter size: 19 Gauge Catheter at skin depth: 12 cm Test dose: negative  Assessment Sensory level: T8 Events: blood not aspirated, injection not painful, no injection resistance, no paresthesia and negative IV test  Additional Notes Patient identified. Risks/Benefits/Options discussed with patient including but not limited to bleeding, infection, nerve damage, paralysis, failed block, incomplete pain control, headache, blood pressure changes, nausea, vomiting, reactions to medication both or allergic, itching and postpartum back pain. Confirmed with bedside nurse the patient's most recent platelet count. Confirmed with patient that they are not currently taking any anticoagulation, have any bleeding history or any family history of bleeding disorders. Patient expressed understanding and wished to proceed. All questions were answered. Sterile technique was used throughout the entire procedure. Please see nursing notes for vital signs. Test dose was given through epidural catheter and negative prior to continuing to dose epidural or start infusion. Warning signs of high block given to the patient including shortness of breath, tingling/numbness in hands, complete motor block,  or any concerning symptoms with instructions to call for help. Patient was given instructions on fall risk and not to get out of bed. All questions and concerns addressed with instructions to call with any issues or inadequate analgesia.  Reason for block:procedure for pain

## 2019-11-17 NOTE — Anesthesia Preprocedure Evaluation (Signed)
Anesthesia Evaluation  Patient identified by MRN, date of birth, ID band Patient awake    Reviewed: Allergy & Precautions, NPO status , Patient's Chart, lab work & pertinent test results  Airway Mallampati: II  TM Distance: >3 FB Neck ROM: Full    Dental no notable dental hx.    Pulmonary neg pulmonary ROS,    Pulmonary exam normal breath sounds clear to auscultation       Cardiovascular negative cardio ROS Normal cardiovascular exam Rhythm:Regular Rate:Normal     Neuro/Psych PSYCHIATRIC DISORDERS Anxiety negative neurological ROS     GI/Hepatic negative GI ROS, Neg liver ROS,   Endo/Other  negative endocrine ROS  Renal/GU negative Renal ROS  negative genitourinary   Musculoskeletal negative musculoskeletal ROS (+)   Abdominal   Peds  Hematology negative hematology ROS (+)   Anesthesia Other Findings   Reproductive/Obstetrics (+) Pregnancy                             Anesthesia Physical Anesthesia Plan  ASA: II  Anesthesia Plan: Epidural   Post-op Pain Management:    Induction:   PONV Risk Score and Plan: Treatment may vary due to age or medical condition  Airway Management Planned: Natural Airway  Additional Equipment:   Intra-op Plan:   Post-operative Plan:   Informed Consent: I have reviewed the patients History and Physical, chart, labs and discussed the procedure including the risks, benefits and alternatives for the proposed anesthesia with the patient or authorized representative who has indicated his/her understanding and acceptance.       Plan Discussed with: Anesthesiologist  Anesthesia Plan Comments: (Patient identified. Risks, benefits, options discussed with patient including but not limited to bleeding, infection, nerve damage, paralysis, failed block, incomplete pain control, headache, blood pressure changes, nausea, vomiting, reactions to medication,  itching, and post partum back pain. Confirmed with bedside nurse the patient's most recent platelet count. Confirmed with the patient that they are not taking any anticoagulation, have any bleeding history or any family history of bleeding disorders. Patient expressed understanding and wishes to proceed. All questions were answered. )        Anesthesia Quick Evaluation  

## 2019-11-18 ENCOUNTER — Encounter (HOSPITAL_COMMUNITY): Payer: Self-pay | Admitting: Obstetrics and Gynecology

## 2019-11-18 ENCOUNTER — Other Ambulatory Visit (HOSPITAL_COMMUNITY): Payer: BC Managed Care – PPO

## 2019-11-18 LAB — RPR: RPR Ser Ql: NONREACTIVE

## 2019-11-18 LAB — CBC
HCT: 36.4 % (ref 36.0–46.0)
Hemoglobin: 11.9 g/dL — ABNORMAL LOW (ref 12.0–15.0)
MCH: 29.8 pg (ref 26.0–34.0)
MCHC: 32.7 g/dL (ref 30.0–36.0)
MCV: 91 fL (ref 80.0–100.0)
Platelets: 206 10*3/uL (ref 150–400)
RBC: 4 MIL/uL (ref 3.87–5.11)
RDW: 13.7 % (ref 11.5–15.5)
WBC: 20 10*3/uL — ABNORMAL HIGH (ref 4.0–10.5)
nRBC: 0 % (ref 0.0–0.2)

## 2019-11-18 MED ORDER — TETANUS-DIPHTH-ACELL PERTUSSIS 5-2.5-18.5 LF-MCG/0.5 IM SUSP: 0.5000 mL | Freq: Once | INTRAMUSCULAR | Status: DC

## 2019-11-18 MED ORDER — ACETAMINOPHEN 325 MG PO TABS
650.0000 mg | ORAL_TABLET | ORAL | Status: DC | PRN
  Administered 2019-11-18 – 2019-11-20 (×4): 650 mg via ORAL
  Filled 2019-11-18 (×4): qty 2

## 2019-11-18 MED ORDER — SENNOSIDES-DOCUSATE SODIUM 8.6-50 MG PO TABS
2.0000 | ORAL_TABLET | ORAL | Status: DC
  Administered 2019-11-18: 2 via ORAL
  Filled 2019-11-18 (×2): qty 2

## 2019-11-18 MED ORDER — SERTRALINE HCL 50 MG PO TABS
50.0000 mg | ORAL_TABLET | Freq: Every day | ORAL | Status: DC
  Administered 2019-11-18 – 2019-11-19 (×2): 50 mg via ORAL
  Filled 2019-11-18 (×2): qty 1

## 2019-11-18 MED ORDER — WITCH HAZEL-GLYCERIN EX PADS
1.0000 "application " | MEDICATED_PAD | CUTANEOUS | Status: DC | PRN
  Administered 2019-11-18: 1 via TOPICAL

## 2019-11-18 MED ORDER — DIPHENHYDRAMINE HCL 25 MG PO CAPS: 25.0000 mg | ORAL_CAPSULE | Freq: Four times a day (QID) | ORAL | Status: DC | PRN

## 2019-11-18 MED ORDER — ONDANSETRON HCL 4 MG PO TABS: 4.0000 mg | ORAL_TABLET | ORAL | Status: DC | PRN

## 2019-11-18 MED ORDER — ONDANSETRON HCL 4 MG/2ML IJ SOLN: 4.0000 mg | INTRAMUSCULAR | Status: DC | PRN

## 2019-11-18 MED ORDER — DIBUCAINE (PERIANAL) 1 % EX OINT: 1.0000 "application " | TOPICAL_OINTMENT | CUTANEOUS | Status: DC | PRN

## 2019-11-18 MED ORDER — SIMETHICONE 80 MG PO CHEW: 80.0000 mg | CHEWABLE_TABLET | ORAL | Status: DC | PRN

## 2019-11-18 MED ORDER — BENZOCAINE-MENTHOL 20-0.5 % EX AERO
1.0000 "application " | INHALATION_SPRAY | CUTANEOUS | Status: DC | PRN
  Administered 2019-11-18 – 2019-11-19 (×2): 1 via TOPICAL
  Filled 2019-11-18 (×2): qty 56

## 2019-11-18 MED ORDER — COCONUT OIL OIL
1.0000 "application " | TOPICAL_OIL | Status: DC | PRN
  Administered 2019-11-18: 1 via TOPICAL

## 2019-11-18 MED ORDER — LORATADINE 10 MG PO TABS
10.0000 mg | ORAL_TABLET | Freq: Every day | ORAL | Status: DC
  Administered 2019-11-18 – 2019-11-19 (×2): 10 mg via ORAL
  Filled 2019-11-18 (×2): qty 1

## 2019-11-18 MED ORDER — PRENATAL MULTIVITAMIN CH
1.0000 | ORAL_TABLET | Freq: Every day | ORAL | Status: DC
  Administered 2019-11-18 – 2019-11-19 (×2): 1 via ORAL
  Filled 2019-11-18 (×2): qty 1

## 2019-11-18 MED ORDER — IBUPROFEN 600 MG PO TABS
600.0000 mg | ORAL_TABLET | Freq: Four times a day (QID) | ORAL | Status: DC
  Administered 2019-11-18 – 2019-11-20 (×9): 600 mg via ORAL
  Filled 2019-11-18 (×9): qty 1

## 2019-11-18 NOTE — Plan of Care (Signed)
Patient requested to resume her Zoloft for management of anxiety and depression. She is actively engaged with the care of her infant. She has showered and independent with self care. Patient is talkative, smiling and asking appropriate questions. She has a good support system and currently her mother is present.

## 2019-11-18 NOTE — Progress Notes (Signed)
Delivery Note At  a viable and healthy female was delivered over intact perineum via  SVD  (Presentation: vtx ; roa ).  APGAR:9 ,9 ; weight  .   Placenta status:delivered spontaneously,intact .  Cord: 3vv,  with the following complications: none.  Anesthesia:  epidural Episiotomy:  none Lacerations:  2nd, hemostatic after repair; bilat periurethral hemostatic Suture Repair: 3.0 vicryl Est. Blood Loss (mL):   Mom to postpartum.  Baby to Couplet care / Skin to Skin.  Vick Frees 11/18/2019, 4:01 AM

## 2019-11-18 NOTE — Progress Notes (Signed)
Comfortable with epidural, feels burning/discomfort after foley catheter placed  Patient Vitals for the past 24 hrs:  BP Temp Temp src Pulse Resp SpO2  11/18/19 0231 139/77 -- -- 90 -- --  11/18/19 0225 -- 98.4 F (36.9 C) Axillary -- -- --  11/18/19 0201 130/79 -- -- 82 18 --  11/18/19 0131 138/80 -- -- 87 -- --  11/18/19 0101 133/84 -- -- 79 -- --  11/18/19 0031 120/79 -- -- 81 -- --  11/18/19 0001 (!) 108/47 -- -- 87 -- --  11/17/19 2349 140/76 -- -- 80 -- --  11/17/19 2342 136/82 -- -- -- -- --  11/17/19 2336 136/90 -- -- 74 -- --  11/17/19 2335 -- 98.8 F (37.1 C) Axillary -- 20 --  11/17/19 2331 135/75 -- -- 96 -- --  11/17/19 2326 (!) 112/51 -- -- 80 -- --  11/17/19 2322 118/60 -- -- 90 -- --  11/17/19 2302 (!) 120/59 -- -- 82 -- --  11/17/19 2246 136/79 -- -- 82 -- --  11/17/19 2241 129/61 -- -- 78 -- --  11/17/19 2232 116/71 -- -- 77 -- 99 %  11/17/19 2231 116/71 -- -- 77 -- --  11/17/19 2226 (!) 119/94 -- -- 74 -- --  11/17/19 2222 126/70 -- -- 86 -- --  11/17/19 2217 125/75 -- -- 83 -- --  11/17/19 2215 (!) 125/52 -- -- 81 -- --  11/17/19 2202 116/70 -- -- 76 -- --  11/17/19 2201 116/70 -- -- 76 -- --  11/17/19 2156 118/80 -- -- 82 -- --  11/17/19 2151 118/74 -- -- 71 -- --  11/17/19 2146 116/75 -- -- 71 18 --  11/17/19 2141 121/69 -- -- 74 -- --  11/17/19 2136 115/66 -- -- 78 -- --  11/17/19 2131 110/86 -- -- (!) 103 -- --  11/17/19 2128 117/82 -- -- 97 -- --  11/17/19 1919 117/66 98.3 F (36.8 C) Oral 73 16 --  11/17/19 1810 138/75 -- -- 73 -- --  11/17/19 1735 -- -- -- -- -- 99 %  11/17/19 1730 -- -- -- -- -- 98 %  11/17/19 1552 125/82 98.2 F (36.8 C) Oral 91 18 97 %   A&ox3 nml respirations Abd: soft,nt, gravid Cx: c/c/+2; s/p srom with clear fluid LE: no edema, nt bilat  FHT: 130s, nml variability, +accels, occ variable Toco: q 2 min  A/P: iup at 40.4 wga 1. Active labor - pt is now pushing with good progress, anticipate svd 2. gbs pos -  contin pcn  3. Fetal status reassuring

## 2019-11-18 NOTE — Anesthesia Postprocedure Evaluation (Signed)
Anesthesia Post Note  Patient: Whitney Castro  Procedure(s) Performed: AN AD HOC LABOR EPIDURAL     Patient location during evaluation: Mother Baby Anesthesia Type: Epidural Level of consciousness: awake and alert Pain management: pain level controlled Vital Signs Assessment: post-procedure vital signs reviewed and stable Respiratory status: spontaneous breathing, nonlabored ventilation and respiratory function stable Cardiovascular status: stable Postop Assessment: no headache, no backache and epidural receding Anesthetic complications: no   No complications documented.  Last Vitals:  Vitals:   11/18/19 0645 11/18/19 1045  BP: 118/79 126/82  Pulse: 82 66  Resp: 18 16  Temp: 36.8 C 36.8 C  SpO2: 100% 98%    Last Pain:  Vitals:   11/18/19 1214  TempSrc:   PainSc: 3    Pain Goal:                   [name]

## 2019-11-18 NOTE — Lactation Note (Signed)
This note was copied from a baby's chart. Lactation Consultation Note  Patient Name: Whitney Castro Today's Date: 11/18/2019 Reason for consult: Initial assessment;1st time breastfeeding;Term P1, 16 hour term female infant.  Infant had one void and three stools since birth. Tools given: mom was given comfort gels, breast shells and coconut oil from RN. Mom has abrasion on her right breast due to previously poor and shallow latches.  LC entered room, infant was cuing to breastfeed, mom latched infant on her left breast using the football hold, swallows were heard by [facility] and family, infant breastfed for 10 minutes. Mom hand expressed after latching infant at breast and infant was given 3 mls of colostrum by spoon and appeared content after feeding. Dad placed infant on his chest and was doing STS as LC left the room.   LC discussed with mom to do breast stimulation to keep infant awake and BF infant STS not swaddled in blankets. Mom will work towards increasing BF duration to maybe 15-20 minutes. Mom knows to call RN or LC if she needs further assistance with latching infant at breast. Mom will continue to latch infant according to hunger cues, on demand, 8 to 12+ times within 24 hours. Mom made aware of O/P services, breastfeeding support groups, community resources, and our phone # for post-discharge questions.   Maternal Data Formula Feeding for Exclusion: No Has patient been taught Hand Expression?: Yes Does the patient have breastfeeding experience prior to this delivery?: No  Feeding Feeding Type: Breast Fed  LATCH Score Latch: Grasps breast easily, tongue down, lips flanged, rhythmical sucking.  Audible Swallowing: Spontaneous and intermittent  Type of Nipple: Everted at rest and after stimulation  Comfort (Breast/Nipple): Filling, red/small blisters or bruises, mild/mod discomfort  Hold (Positioning): Assistance needed to correctly position infant at breast and maintain  latch.  LATCH Score: 8  Interventions Interventions: Breast feeding basics reviewed;Assisted with latch;Breast compression;Shells;Skin to skin;Adjust position;Comfort gels;Support pillows;Breast massage;Hand express;Position options  Lactation Tools Discussed/Used WIC Program: No   Consult Status Consult Status: Follow-up Date: 11/19/19 Follow-up type: In-patient    Danelle Earthly 11/18/2019, 8:23 PM

## 2019-11-18 NOTE — Progress Notes (Signed)
MOB was referred for history of depression/anxiety. °* Referral screened out by Clinical Social Worker because none of the following criteria appear to apply: °~ History of anxiety/depression during this pregnancy, or of post-partum depression following prior delivery. °~ Diagnosis of anxiety and/or depression within last 3 years °OR °* MOB's symptoms currently being treated with medication and/or therapy. Per further chart review, it appears that Mob is on Zoloft for anxiety/depression.  ° ° ° °Please contact the Clinical Social Worker if needs arise, by MOB request, or if MOB scores greater than 9/yes to question 10 on Edinburgh Postpartum Depression Screen. ° ° °Alessander Sikorski S. Dannya Pitkin, MSW, LCSW °[facility] [place] °[phone]  °

## 2019-11-19 MED ORDER — ACETAMINOPHEN 325 MG PO TABS: 650.0000 mg | ORAL_TABLET | ORAL | 1 refills | Status: DC | PRN

## 2019-11-19 MED ORDER — COCONUT OIL OIL: 1.0000 "application " | TOPICAL_OIL | 0 refills | Status: DC | PRN

## 2019-11-19 MED ORDER — IBUPROFEN 600 MG PO TABS: 600.0000 mg | ORAL_TABLET | Freq: Four times a day (QID) | ORAL | 0 refills | Status: DC

## 2019-11-19 NOTE — Progress Notes (Addendum)
PPD # 1 S/P NSVD  Live born female  Birth Weight: 8 lb 0.2 oz (3634 g) APGAR: 9, 9  Newborn Delivery   Birth date/time: 11/18/2019 03:30:00 Delivery type: Vaginal, Spontaneous     Baby name: Ava Delivering provider: Rhoderick Moody E  Episiotomy:None   Lacerations:Periurethral;2nd degree   Feeding: breast  Pain control at delivery: Epidural   S:  Reports feeling well             Tolerating po/ No nausea or vomiting             Bleeding is moderate             Pain controlled with acetaminophen and ibuprofen (OTC)             Up ad lib / ambulatory / voiding without difficulties   O:  A & O x 3, in no apparent distress              VS:  Vitals:   11/18/19 2108 11/19/19 0508 11/19/19 1443 11/19/19 2149  BP: 124/71 122/82 125/82 127/86  Pulse: 79 67 76 72  Resp: 16  18 19   Temp: 98.2 F (36.8 C) 97.9 F (36.6 C) 98.2 F (36.8 C) 98.2 F (36.8 C)  TempSrc: Oral Oral Oral Oral  SpO2:   98%     LABS:  Recent Labs    11/17/19 1745 11/18/19 0732  WBC 13.3* 20.0*  HGB 12.6 11.9*  HCT 38.2 36.4  PLT 224 206    Blood type: --/--/B POS (07/18 1745)  Rubella: Immune (07/07 0000)   I&O: No intake/output data recorded.          No intake/output data recorded.  Vaccines: TDaP UTD         Flu    Declined   Gen: AAO x 3, NAD  Abdomen: soft, non-tender, non-distended             Fundus: firm, non-tender, U-1  Perineum: healing, no edema  Lochia: moderate  Extremities: no edema, no calf pain or tenderness    A/P: PPD # 1 24 y.o., G1P1001   Principal Problem:   Postpartum care following vaginal delivery 7/19 Active Problems:   SVD (spontaneous vaginal delivery)   Perineal laceration, second degree, bilat periurethral    Doing well - stable status  Routine post partum orders             Pt desires early discharge today. Baby was kept inpatient and mother elected to remain inpatient.   Anticipate discharge tomorrow   8/19, MSN, CNM 11/19/2019, 10:16  PM

## 2019-11-19 NOTE — Progress Notes (Signed)
Patient given and instructed on use of abdominal binder.

## 2019-11-19 NOTE — Discharge Summary (Signed)
OB Discharge Summary  Patient Name: Whitney Castro DOB: [birth year] MRN: [id number]  Date of admission: 11/17/2019 Delivering provider: Rhoderick Moody E   Admitting diagnosis: Normal labor [O80, Z37.9] Intrauterine pregnancy: [redacted]w[redacted]d     Secondary diagnosis: Patient Active Problem List   Diagnosis Date Noted  . Postpartum care following vaginal delivery 7/19 11/18/2019  . Perineal laceration, second degree, bilat periurethral  11/18/2019  . SVD (spontaneous vaginal delivery) 11/17/2019   Additional problems: None   Date of discharge: 11/19/2019   Discharge diagnosis: Principal Problem:   Postpartum care following vaginal delivery 7/19 Active Problems:   SVD (spontaneous vaginal delivery)   Perineal laceration, second degree, bilat periurethral                                                    Post partum procedures: None  Augmentation: N/A Pain control: Epidural  Laceration:Periurethral;2nd degree  Episiotomy:None  Complications: None  Hospital course:  Onset of Labor With Vaginal Delivery      24 y.o. yo G1P1001 at [redacted]w[redacted]d was admitted in Latent Labor on 11/17/2019. Patient had an uncomplicated labor course as follows:  Membrane Rupture Time/Date: 2:16 AM ,11/18/2019   Delivery Method:Vaginal, Spontaneous  Episiotomy: None  Lacerations:  Periurethral;2nd degree  Patient had an uncomplicated postpartum course.  She is ambulating, tolerating a regular diet, passing flatus, and urinating well. Will continue Zoloft 50mg  at discharge. Patient is discharged home in stable condition on 11/19/19.   Newborn Data: Birth date:11/18/2019  Birth time:3:30 AM  Gender:Female  Living status:Living  Apgars:9 ,9  11/20/2019 g   Physical exam  Vitals:   11/18/19 1445 11/18/19 1950 11/18/19 2108 11/19/19 0508  BP: 123/78 130/74 124/71 122/82  Pulse: 76 74 79 67  Resp: 16 18 16    Temp: 98.1 F (36.7 C) 98.4 F (36.9 C) 98.2 F (36.8 C) 97.9 F (36.6 C)  TempSrc: Oral Oral Oral Oral   SpO2: 98% 100%     General: alert, cooperative and no distress Lochia: appropriate Uterine Fundus: firm Perineum: repair intact, no edema DVT Evaluation: No evidence of DVT seen on physical exam. Labs: Lab Results  Component Value Date   WBC 20.0 (H) 11/18/2019   HGB 11.9 (L) 11/18/2019   HCT 36.4 11/18/2019   MCV 91.0 11/18/2019   PLT 206 11/18/2019   No flowsheet data found. No flowsheet data found. Vaccines: TDaP UTD         Flu    Declined  Discharge instruction:  per After Visit Summary,  [facility] booklet and  "Understanding Mother & Baby Care" hospital booklet  After Visit Meds:  Allergies as of 11/19/2019   No Known Allergies     Medication List    STOP taking these medications   Iron 28 MG Tabs   loratadine 10 MG tablet Commonly known as: CLARITIN   terconazole 0.8 % vaginal cream Commonly known as: TERAZOL 3     TAKE these medications   acetaminophen 325 MG tablet Commonly known as: Tylenol Take 2 tablets (650 mg total) by mouth every 4 (four) hours as needed (for pain scale < 4). What changed:   medication strength  how much to take  when to take this  reasons to take this   coconut oil Oil Apply 1 application topically as needed.   ibuprofen 600 MG tablet Commonly  known as: ADVIL Take 1 tablet (600 mg total) by mouth every 6 (six) hours.   multivitamin-prenatal 27-0.8 MG Tabs tablet Take 1 tablet by mouth daily at 12 noon.   sertraline 50 MG tablet Commonly known as: ZOLOFT Take 50 mg by mouth daily.            Discharge Care Instructions  (From admission, onward)         Start     Ordered   11/19/19 0000  Discharge wound care:       Comments: Sitz baths 2 times /day with warm water x 1 week. May add herbals: 1 ounce dried comfrey leaf* 1 ounce calendula flowers 1 ounce lavender flowers  Supplies can be found online at [employer] sources at [employer], [employer]  1/2 ounce dried uva ursi  leaves 1/2 ounce witch hazel blossoms (if you can find them) 1/2 ounce dried sage leaf 1/2 cup sea salt Directions: Bring 2 quarts of water to a boil. Turn off heat, and place 1 ounce (approximately 1 large handful) of the above mixed herbs (not the salt) into the pot. Steep, covered, for 30 minutes.  Strain the liquid well with a fine mesh strainer, and discard the herb material. Add 2 quarts of liquid to the tub, along with the 1/2 cup of salt. This medicinal liquid can also be made into compresses and peri-rinses.   11/19/19 3491         Diet: routine diet  Activity: Advance as tolerated. Pelvic rest for 6 weeks.   Postpartum contraception: Not Discussed  Newborn Data: Live born female  Birth Weight: 8 lb 0.2 oz (3634 g) APGAR: 9, 9  Newborn Delivery   Birth date/time: 11/18/2019 03:30:00 Delivery type: Vaginal, Spontaneous     Named Ava Baby Feeding: Breast Disposition:home with mother  Delivery Report:   Review the Delivery Report for details.    Follow up:  Follow-up Information    Noland Fordyce, MD .   Specialty: Obstetrics and Gynecology Contact information: [address] [place] [address] [phone]              Signed: Clancy Gourd, MSN 11/19/2019, 9:25 AM

## 2019-11-19 NOTE — Lactation Note (Signed)
This note was copied from a baby's chart. Lactation Consultation Note  Patient Name: Whitney Castro [id number] Date: 11/19/2019 Reason for consult: Follow-up assessment;Primapara;1st time breastfeeding;Term;Infant weight loss;Nipple pain/trauma;Other (Comment) (milk is in - not engorged)  Baby is 36 hours old  As LC entered the room , per mom  had recently fed with swallows. Initially feeling discomfort and improves into the feeding.  LC offered to assess sore nipples and noted her breast to be full, warm and milk is in . Areolas both have edema. No  Breakdown of either nipple . ( mom has comfort gels , coconut oil and shells , hand pump. Mom is able to express her milk well.  Baby rooting and LC had mom to undress and worked on latching on the left breast / football/ worked on depth/ baby fed for 20 mins with increased swallows with compressions and breast soften.  When baby released nipple well rounded and per mom more comfortable.  Mom requested a written LC plan. LC wrote her a LC plan.  LC plan:  Sore nipple tx and engorgement prevention ( comfort gels x 6 days and alternating with shells while awake)  Steps for latching - breast massage, hand express, pre-pump to elongate the nipple / areola complex, reverse pressure , latch with firm support and compressions.  Mom needs to be F/U tomorrow.    Maternal Data Has patient been taught Hand Expression?: Yes  Feeding Feeding Type: Breast Fed  LATCH Score Latch: Grasps breast easily, tongue down, lips flanged, rhythmical sucking.  Audible Swallowing: Spontaneous and intermittent  Type of Nipple: Everted at rest and after stimulation  Comfort (Breast/Nipple): Filling, red/small blisters or bruises, mild/mod discomfort  Hold (Positioning): Assistance needed to correctly position infant at breast and maintain latch.  LATCH Score: 8  Interventions Interventions: Breast feeding basics reviewed;Assisted with latch;Skin to skin;Hand  express;Breast compression;Adjust position;Support pillows;Position options;Shells  Lactation Tools Discussed/Used Tools: Shells;Pump;Flanges;Coconut oil;Comfort gels Flange Size: 24 Shell Type: Inverted Breast pump type: Manual Pump Review: Milk Storage   Consult Status Consult Status: Follow-up Date: 11/20/19 Follow-up type: In-patient    Whitney Castro 11/19/2019, 3:32 PM

## 2019-11-20 ENCOUNTER — Inpatient Hospital Stay (HOSPITAL_COMMUNITY): Payer: BC Managed Care – PPO

## 2019-11-20 ENCOUNTER — Inpatient Hospital Stay (HOSPITAL_COMMUNITY): Admission: AD | Admit: 2019-11-20 | Payer: BC Managed Care – PPO | Source: Home / Self Care | Admitting: Obstetrics
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
## 2019-11-20 NOTE — Lactation Note (Signed)
This note was copied from a baby's chart. Lactation Consultation Note  Patient Name: Whitney Castro [id number] Date: 11/20/2019 Reason for consult: Follow-up assessment;Primapara;1st time breastfeeding;Term;Infant weight loss;Other (Comment) (milk is in) Baby is 60 hours old  As Lc entered the room, per mom my breast are really full and mom milk came in.  [facility] offered to assess breast tissue for engorgement and mom receptive.  Breast full, with few nodules , not engorged .  Per mom the baby had a good feeding around 730 am and then has been snacking. Baby awake and LC recommended working on the latching and depth and while LC in the room had 2 - 5 min feedings with deep latches and release on her own after gulping and softening down the breast.  LC provided ice and recommended applying the ice for 15 -20 mins and then pumping to comfort.  Per mom the ice was comforting.  LC reviewed sore nipple and engorgement prevention and tx .  LC reminded mom the areola needs to be compressible like a thinner sandwich prior to feeding to obtain a deep latch.  Mom has comfort gels and breast shells, hand pump and a DEBP at home.  Mom has the [facility] pamphlet with phone numbers if needed.    Maternal Data    Feeding Feeding Type: Breast Fed  LATCH Score Latch: Grasps breast easily, tongue down, lips flanged, rhythmical sucking.  Audible Swallowing: Spontaneous and intermittent  Type of Nipple: Everted at rest and after stimulation  Comfort (Breast/Nipple): Filling, red/small blisters or bruises, mild/mod discomfort  Hold (Positioning): Assistance needed to correctly position infant at breast and maintain latch.  LATCH Score: 8  Interventions Interventions: Breast feeding basics reviewed;Assisted with latch;Skin to skin;Breast compression;Adjust position;Support pillows;Position options;Reverse pressure;Shells;Hand pump  Lactation Tools Discussed/Used Tools: Shells;Pump;Flanges Flange Size:  24;27 Shell Type: Inverted Breast pump type: Manual Pump Review: Milk Storage;Setup, frequency, and cleaning   Consult Status Consult Status: Follow-up Date: 11/20/19 Follow-up type: In-patient    Matilde Sprang Jadamarie Butson 11/20/2019, 8:54 AM

## 2019-11-20 NOTE — Discharge Summary (Addendum)
Postpartum Discharge Summary  Date of Service updated 11/20/2019     Patient Name: Whitney Castro DOB: 02-23-1996 MRN: [id number]  Date of admission: 11/17/2019 Delivery date:11/18/2019  Delivering provider: Tiana Loft E  Date of discharge: 11/20/2019  Admitting diagnosis: Normal labor [O80, Z37.9] Intrauterine pregnancy: [redacted]w[redacted]d    Secondary diagnosis:  Principal Problem:   Postpartum care following vaginal delivery 7/19 Active Problems:   SVD (spontaneous vaginal delivery)   Perineal laceration, second degree, bilat periurethral   Additional problems: hx. Of anxiety and depression on Zoloft     Discharge diagnosis: Term Pregnancy Delivered, Anxiety and Depression                                              Post partum procedures: n/a Augmentation: N/A Complications: None  Hospital course: Onset of Labor With Vaginal Delivery      24y.o. yo G1P1001 at 430w4das admitted in Latent Labor on 11/17/2019. Patient had an uncomplicated labor course as follows:  Membrane Rupture Time/Date: 2:16 AM ,11/18/2019   Delivery Method:Vaginal, Spontaneous  Episiotomy: None  Lacerations:  Periurethral;2nd degree  Patient had an uncomplicated postpartum course.  She is ambulating, tolerating a regular diet, passing flatus, and urinating well. Patient is discharged home in stable condition on 11/20/19.  Newborn Data: Birth date:11/18/2019  Birth time:3:30 AM  Gender:Female  Living status:Living  Apgars:9 ,9  WeTIRWER:1540   "Ava"  Magnesium Sulfate received: No BMZ received: No Rhophylac:N/A MMR:N/A T-DaP:Given prenatally Flu: N/A Transfusion:No  Physical exam  Vitals:   11/19/19 0508 11/19/19 1443 11/19/19 2149 11/20/19 0516  BP: 122/82 125/82 127/86 125/80  Pulse: 67 76 72 76  Resp:  '18 19 18  ' Temp: 97.9 F (36.6 C) 98.2 F (36.8 C) 98.2 F (36.8 C) 98.1 F (36.7 C)  TempSrc: Oral Oral Oral Oral  SpO2:  98%  98%   General: alert and cooperative, tearful upon initial  assessment Lochia: appropriate Uterine Fundus: firm Perineum: healing well, minimal edema DVT Evaluation: No evidence of DVT seen on physical exam. No significant calf/ankle edema. Labs: Lab Results  Component Value Date   WBC 20.0 (H) 11/18/2019   HGB 11.9 (L) 11/18/2019   HCT 36.4 11/18/2019   MCV 91.0 11/18/2019   PLT 206 11/18/2019   No flowsheet data found. Edinburgh Score: Edinburgh Postnatal Depression Scale Screening Tool 11/19/2019  I have been able to laugh and see the funny side of things. 0  I have looked forward with enjoyment to things. 0  I have blamed myself unnecessarily when things went wrong. 1  I have been anxious or worried for no good reason. 2  I have felt scared or panicky for no good reason. 1  Things have been getting on top of me. 0  I have been so unhappy that I have had difficulty sleeping. 0  I have felt sad or miserable. 0  I have been so unhappy that I have been crying. 0  The thought of harming myself has occurred to me. 0  Edinburgh Postnatal Depression Scale Total 4      After visit meds:  Allergies as of 11/20/2019   No Known Allergies     Medication List    STOP taking these medications   Iron 28 MG Tabs   loratadine 10 MG tablet Commonly known as: CLARITIN  terconazole 0.8 % vaginal cream Commonly known as: TERAZOL 3     TAKE these medications   acetaminophen 325 MG tablet Commonly known as: Tylenol Take 2 tablets (650 mg total) by mouth every 4 (four) hours as needed (for pain scale < 4). What changed:   medication strength  how much to take  when to take this  reasons to take this   coconut oil Oil Apply 1 application topically as needed.   ibuprofen 600 MG tablet Commonly known as: ADVIL Take 1 tablet (600 mg total) by mouth every 6 (six) hours.   multivitamin-prenatal 27-0.8 MG Tabs tablet Take 1 tablet by mouth daily at 12 noon.   sertraline 50 MG tablet Commonly known as: ZOLOFT Take 50 mg by mouth  daily.            Discharge Care Instructions  (From admission, onward)         Start     Ordered   11/19/19 0000  Discharge wound care:       Comments: Sitz baths 2 times /day with warm water x 1 week. May add herbals: 1 ounce dried comfrey leaf* 1 ounce calendula flowers 1 ounce lavender flowers  Supplies can be found online at [employer] sources at [employer], [employer]  1/2 ounce dried uva ursi leaves 1/2 ounce witch hazel blossoms (if you can find them) 1/2 ounce dried sage leaf 1/2 cup sea salt Directions: Bring 2 quarts of water to a boil. Turn off heat, and place 1 ounce (approximately 1 large handful) of the above mixed herbs (not the salt) into the pot. Steep, covered, for 30 minutes.  Strain the liquid well with a fine mesh strainer, and discard the herb material. Add 2 quarts of liquid to the tub, along with the 1/2 cup of salt. This medicinal liquid can also be made into compresses and peri-rinses.   11/19/19 0921           Discharge home in stable condition Infant Feeding: Breast Infant Disposition:home with mother Discharge instruction: per After Visit Summary and Postpartum booklet. Activity: Advance as tolerated. Pelvic rest for 6 weeks.  Diet: low salt diet Anticipated Birth Control: Unsure Postpartum Appointment:6 weeks Additional Postpartum F/U: Postpartum Depression checkup in 2 weeks Future Appointments:No future appointments. Follow up Visit:  Follow-up Information    Aloha Gell, MD. Schedule an appointment as soon as possible for a visit in 2 week(s).   Specialty: Obstetrics and Gynecology Why: Postpartum mood check Contact information: [place] [address] [phone]               11/20/2019 Darliss Cheney, CNM

## 2019-12-18 ENCOUNTER — Other Ambulatory Visit: Payer: BC Managed Care – PPO

## 2020-01-17 IMAGING — US US OB < 14 WEEKS - US OB TV
2 series · 15 of 28 positions shown · non-contrast
Comparison: None.

CLINICAL DATA: Abnormal vaginal bleeding.  Early pregnancy.

EXAM:
OBSTETRIC <14 WK ULTRASOUND
TECHNIQUE: Transabdominal ultrasound was performed for evaluation of the
gestation as well as the maternal uterus and adnexal regions.

[Series 1: us ob < 14 weeks - us ob tv · 53 acquisitions, 14 frames shown (1 of 2)]
[im 1/53]
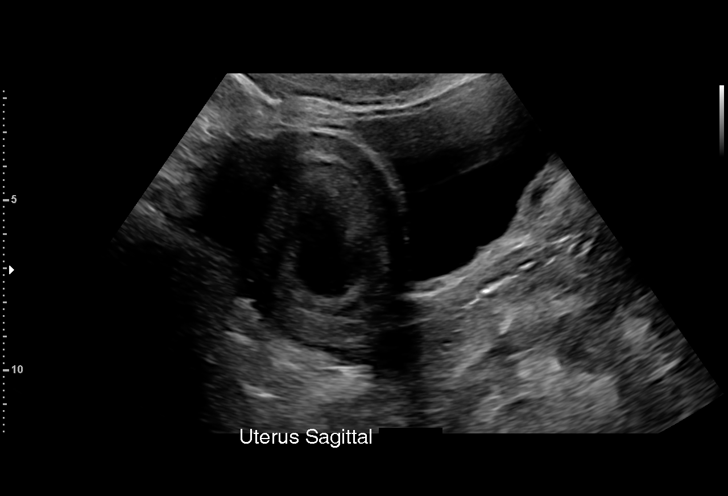
[im 5/53]
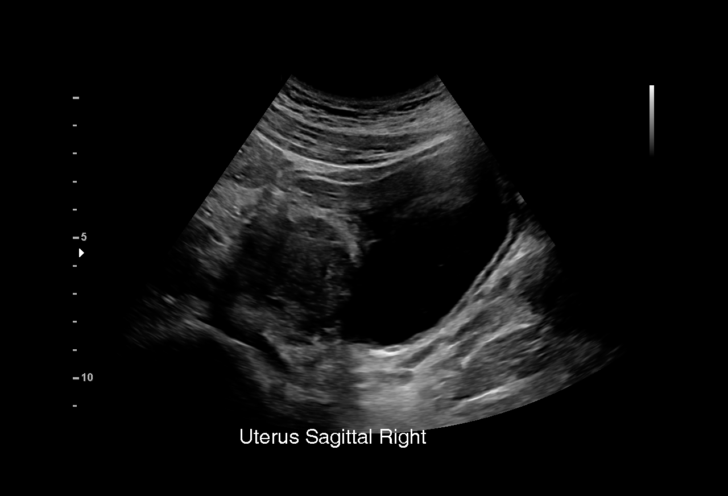
[im 9/53]
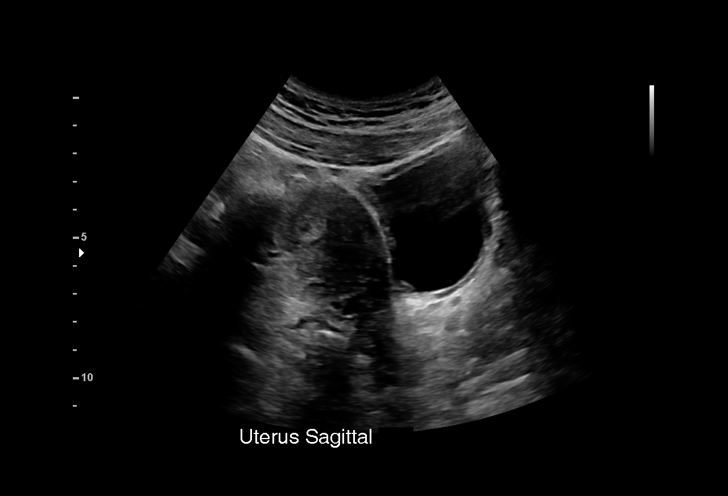
[im 13/53]
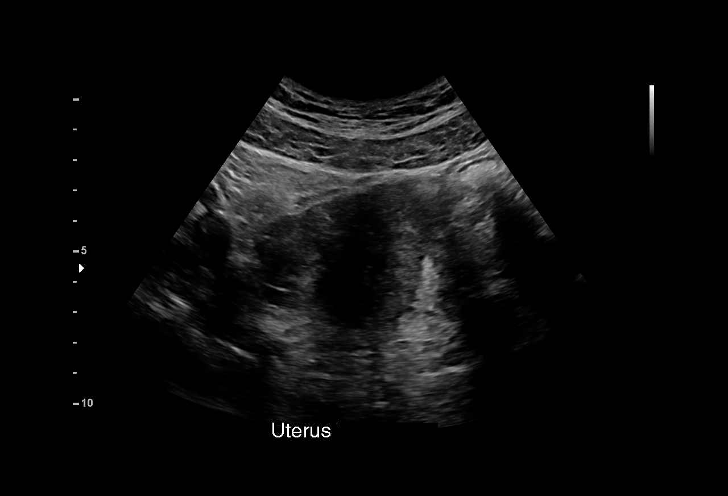
[im 17/53]
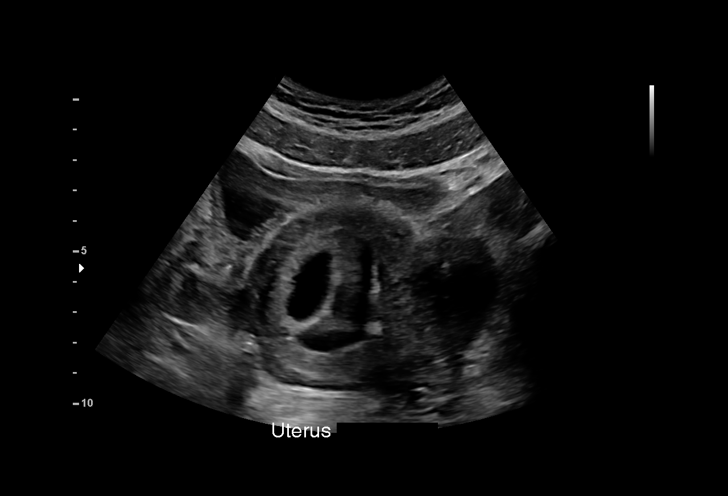
[im 21/53]
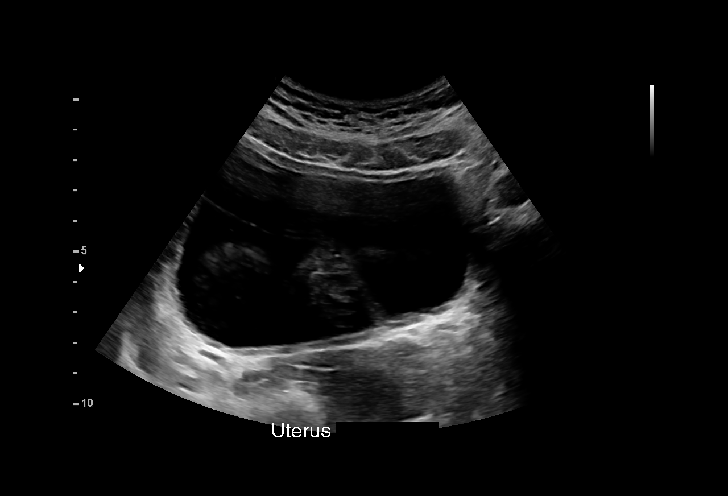
[im 25/53]
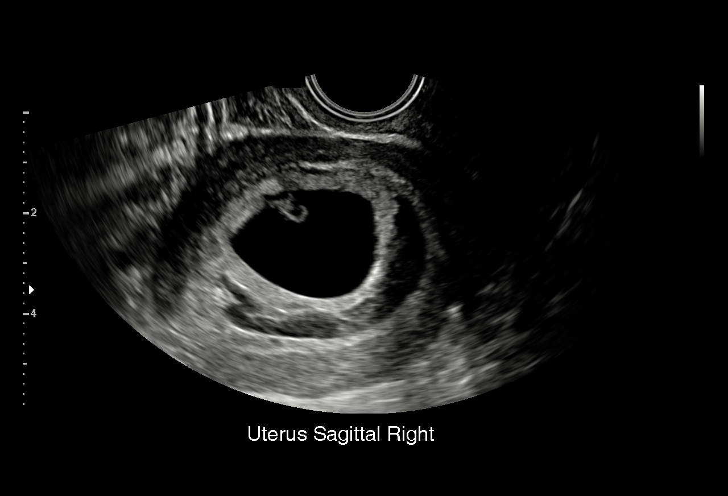
[im 29/53]
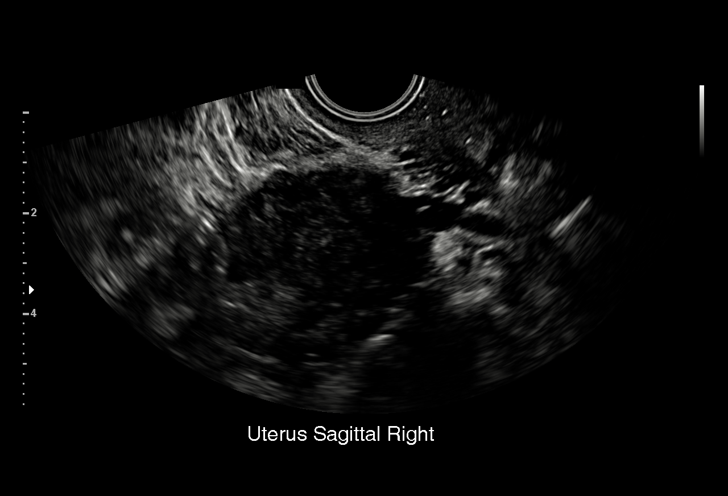
[im 31/53]
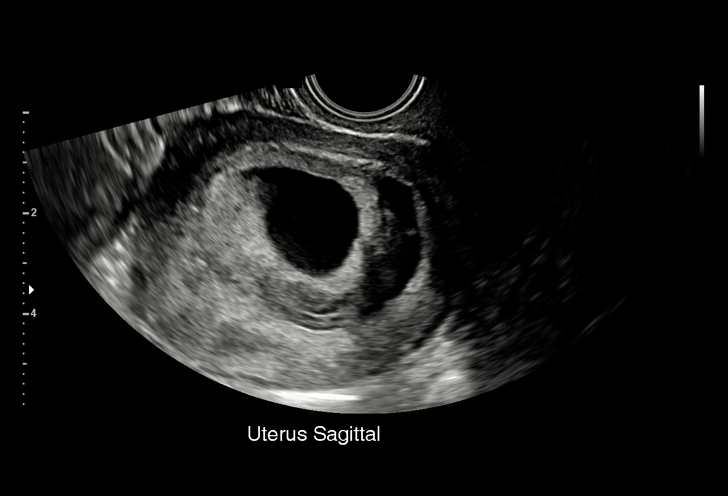
[im 35/53]
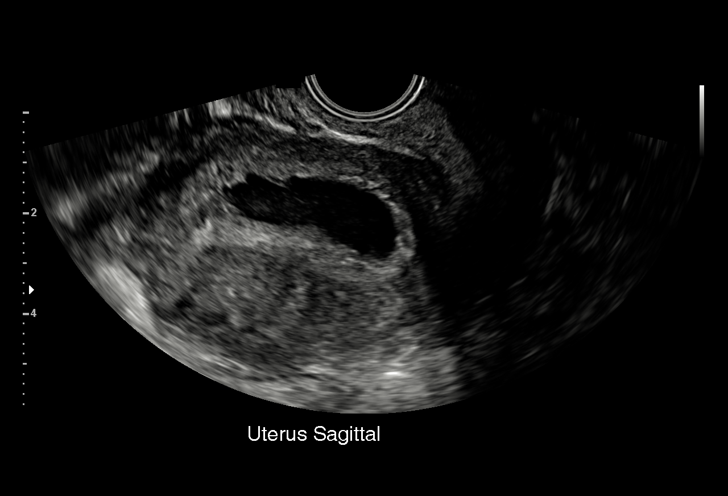
[im 39/53]
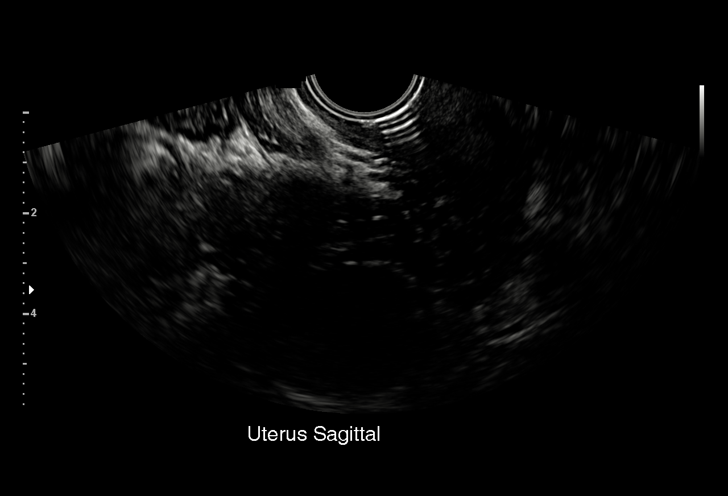
[im 43/53]
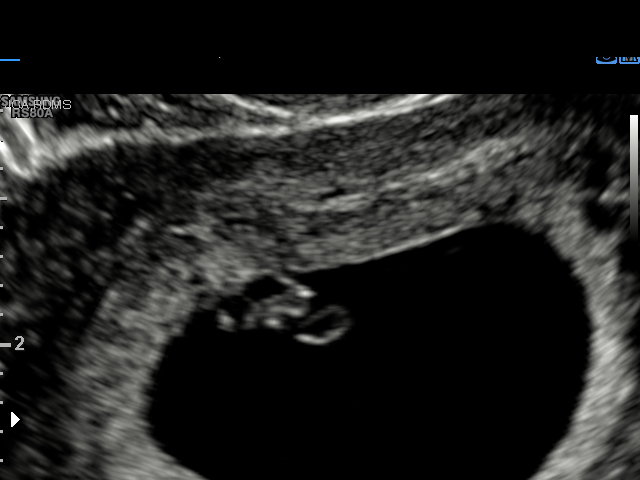
[im 47/53]
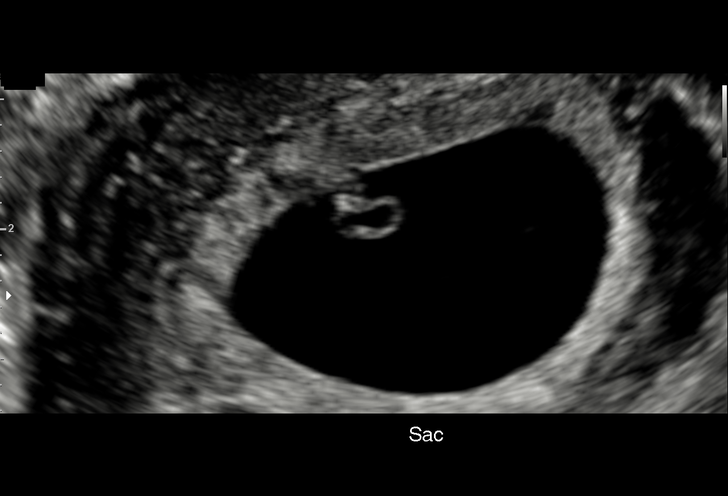
[im 51/53]
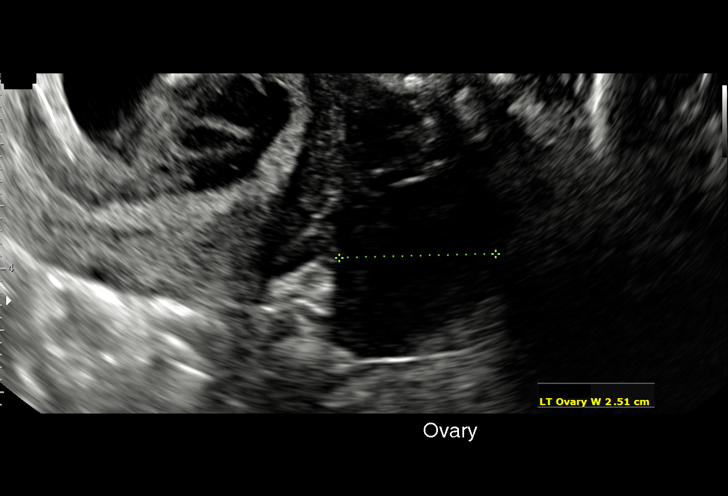

[Series 2: us ob < 14 weeks - us ob tv · 1 of 1 slices shown (2 of 2)]
[im 1/1]
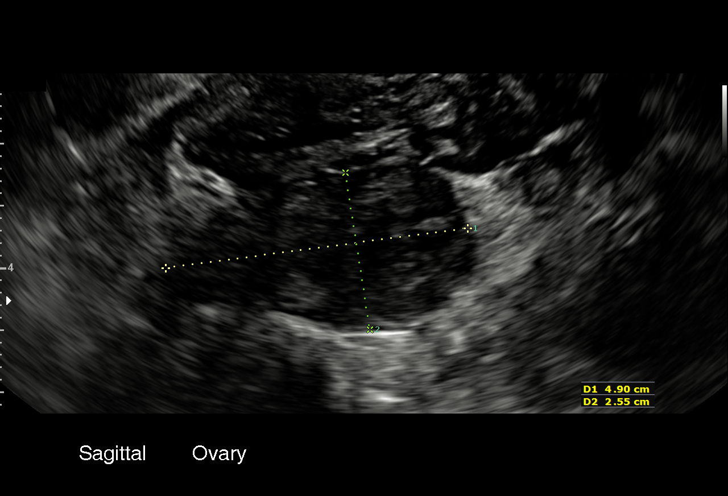

[15 of 28 positions shown; findings below may reference images not displayed]

FINDINGS: Intrauterine gestational sac: Single

Yolk sac:  Yes

Embryo:  Yes

Cardiac Activity: Yes

Heart Rate: 121 bpm

CRL:   7.1 mm   6 w 4 d                  US EDC: 11/14/2019

Subchorionic hemorrhage:  Large subchorionic hemorrhage.

Maternal uterus/adnexae: Normal ovaries.  No free fluid.
IMPRESSION: Large subchorionic hemorrhage. Intrauterine pregnancy of
approximately 6 weeks 4 days gestation.

## 2020-08-08 ENCOUNTER — Other Ambulatory Visit: Payer: Self-pay

## 2020-08-08 ENCOUNTER — Encounter (HOSPITAL_COMMUNITY): Payer: Self-pay | Admitting: *Deleted

## 2020-08-08 ENCOUNTER — Ambulatory Visit (HOSPITAL_COMMUNITY)
Admission: EM | Admit: 2020-08-08 | Discharge: 2020-08-08 | Disposition: A | Discharge status: Home / Self Care | Payer: No Typology Code available for payment source | Attending: Family Medicine | Admitting: Family Medicine

## 2020-08-08 DIAGNOSIS — S1096XA Insect bite of unspecified part of neck, initial encounter: Secondary | ICD-10-CM

## 2020-08-08 DIAGNOSIS — R21 Rash and other nonspecific skin eruption: Secondary | ICD-10-CM | POA: Diagnosis not present

## 2020-08-08 DIAGNOSIS — W57XXXA Bitten or stung by nonvenomous insect and other nonvenomous arthropods, initial encounter: Secondary | ICD-10-CM

## 2020-08-08 MED ORDER — TRIAMCINOLONE ACETONIDE 0.1 % EX CREA: 1.0000 "application " | TOPICAL_CREAM | Freq: Two times a day (BID) | CUTANEOUS | 0 refills | Status: DC

## 2020-08-08 NOTE — ED Provider Notes (Signed)
[facility]    CSN: [id number] Arrival date & time: 08/08/20  1032      History   Chief Complaint Chief Complaint  Patient presents with  . Insect Bite    HPI Whitney Castro is a 25 y.o. female.   Patient presenting today with concern over a tick bite that is itchy swollen and red for the past 5 days.  She states she put the tick out on Monday and at first had no issues but now it has become increasingly more itchy and red.  She has not tried anything over-the-counter on it thus far.  No rashes elsewhere on the body including palms and soles, no bull's-eye pattern with surrounding erythema, no fever, chills, headaches, fatigue, joint pains, myalgias.     Past Medical History:  Diagnosis Date  . Anxiety   . B12 deficiency   . Medical history non-contributory   . Ovarian cyst   . Vitamin D deficiency     Patient Active Problem List   Diagnosis Date Noted  . Postpartum care following vaginal delivery 7/19 11/18/2019  . Perineal laceration, second degree, bilat periurethral  11/18/2019  . SVD (spontaneous vaginal delivery) 11/17/2019    Past Surgical History:  Procedure Laterality Date  . APPENDECTOMY    . LIGAMENT REPAIR     knee and elbow    OB History    Gravida  1   Para  1   Term  1   Preterm      AB      Living  1     SAB      IAB      Ectopic      Multiple  0   Live Births  1            Home Medications    Prior to Admission medications   Medication Sig Start Date End Date Taking? Authorizing Provider  triamcinolone cream (KENALOG) 0.1 % Apply 1 application topically 2 (two) times daily. 08/08/20  Yes Particia Nearing, PA-C  acetaminophen (TYLENOL) 325 MG tablet Take 2 tablets (650 mg total) by mouth every 4 (four) hours as needed (for pain scale < 4). 11/19/19   June Leap, CNM  coconut oil OIL Apply 1 application topically as needed. 11/19/19   June Leap, CNM  ibuprofen (ADVIL) 600 MG tablet Take 1 tablet  (600 mg total) by mouth every 6 (six) hours. 11/19/19   June Leap, CNM  Prenatal Vit-Fe Fumarate-FA (MULTIVITAMIN-PRENATAL) 27-0.8 MG TABS tablet Take 1 tablet by mouth daily at 12 noon.    [provider]  sertraline (ZOLOFT) 50 MG tablet Take 50 mg by mouth daily.    [provider]    Family History History reviewed. No pertinent family history.  Social History Social History   Tobacco Use  . Smoking status: Never Smoker  . Smokeless tobacco: Never Used  Substance Use Topics  . Alcohol use: Not Currently  . Drug use: Never     Allergies   Patient has no known allergies.   Review of Systems Review of Systems Per HPI  Physical Exam Triage Vital Signs ED Triage Vitals  Enc Vitals Group     BP 08/08/20 1055 117/77     Pulse Rate 08/08/20 1055 86     Resp 08/08/20 1055 18     Temp 08/08/20 1055 98 F (36.7 C)     Temp Source 08/08/20 1055 Oral     SpO2  08/08/20 1055 96 %     Weight --      Height --      Head Circumference --      Peak Flow --      Pain Score 08/08/20 1052 2     Pain Loc --      Pain Edu? --      Excl. in GC? --    No data found.  Updated Vital Signs BP 117/77 (BP Location: Right Arm)   Pulse 86   Temp 98 F (36.7 C) (Oral)   Resp 18   SpO2 96%   Visual Acuity Right Eye Distance:   Left Eye Distance:   Bilateral Distance:    Right Eye Near:   Left Eye Near:    Bilateral Near:     Physical Exam Vitals and nursing note reviewed.  Constitutional:      Appearance: Normal appearance. She is not ill-appearing.  HENT:     Head: Atraumatic.  Eyes:     Extraocular Movements: Extraocular movements intact.     Conjunctiva/sclera: Conjunctivae normal.  Cardiovascular:     Rate and Rhythm: Normal rate and regular rhythm.     Heart sounds: Normal heart sounds.  Pulmonary:     Effort: Pulmonary effort is normal.     Breath sounds: Normal breath sounds.  Musculoskeletal:        General: Normal range of motion.      Cervical back: Normal range of motion and neck supple.  Skin:    General: Skin is warm and dry.     Findings: Lesion present.     Comments: Small tick bite present on upper back/inferior neck posteriorly, minimal erythema to direct area but no surrounding erythema, bull's-eye pattern noted.  No rashes elsewhere on the body.  Neurological:     Mental Status: She is alert and oriented to person, place, and time.  Psychiatric:        Mood and Affect: Mood normal.        Thought Content: Thought content normal.        Judgment: Judgment normal.      UC Treatments / Results  Labs (all labs ordered are listed, but only abnormal results are displayed) Labs Reviewed - No data to display  EKG   Radiology No results found.  Procedures Procedures (including critical care time)  Medications Ordered in UC Medications - No data to display  Initial Impression / Assessment and Plan / UC Course  I have reviewed the triage vital signs and the nursing notes.  Pertinent labs & imaging results that were available during my care of the patient were reviewed by me and considered in my medical decision making (see chart for details).     Patient is well-appearing today with normal vital signs.  Suspect simple localized site reaction from the tick bite causing her itching and redness.  Will treat with triamcinolone cream, antihistamines, good cleaning wound care.  No evidence of tickborne illness today but did discuss with patient that these usually do not show symptoms until 2 to 4 weeks post bite.  Follow-up with primary care if beginning to develop any of the symptoms discussed.  Final Clinical Impressions(s) / UC Diagnoses   Final diagnoses:  Rash  Tick bite of neck, initial encounter   Discharge Instructions   None    ED Prescriptions    Medication Sig Dispense Auth. Provider   triamcinolone cream (KENALOG) 0.1 % Apply 1 application topically 2 (two)  times daily. 30 g Particia Nearing, [place]     PDMP not reviewed this encounter.   Particia Nearing, [place] 08/08/20 1728

## 2020-08-08 NOTE — ED Triage Notes (Signed)
Pt reports she removed a tick from the base of her nEck on Monday. Pt now has NECK soreness  ,rash and itching.

## 2020-08-12 LAB — HM PAP SMEAR

## 2020-09-16 ENCOUNTER — Other Ambulatory Visit: Payer: Self-pay

## 2020-09-16 ENCOUNTER — Encounter: Payer: Self-pay | Admitting: Internal Medicine

## 2020-09-16 ENCOUNTER — Ambulatory Visit (INDEPENDENT_AMBULATORY_CARE_PROVIDER_SITE_OTHER): Payer: No Typology Code available for payment source | Admitting: Internal Medicine

## 2020-09-16 VITALS — BP 116/70 | HR 83 | Temp 98.0°F | Resp 16 | Ht 64.0 in | Wt 188.0 lb

## 2020-09-16 DIAGNOSIS — F331 Major depressive disorder, recurrent, moderate: Secondary | ICD-10-CM

## 2020-09-16 DIAGNOSIS — R7989 Other specified abnormal findings of blood chemistry: Secondary | ICD-10-CM

## 2020-09-16 DIAGNOSIS — Z0001 Encounter for general adult medical examination with abnormal findings: Secondary | ICD-10-CM | POA: Diagnosis not present

## 2020-09-16 DIAGNOSIS — R5382 Chronic fatigue, unspecified: Secondary | ICD-10-CM | POA: Diagnosis not present

## 2020-09-16 LAB — BASIC METABOLIC PANEL
BUN: 13 mg/dL (ref 6–23)
CO2: 27 mEq/L (ref 19–32)
Calcium: 9.3 mg/dL (ref 8.4–10.5)
Chloride: 104 mEq/L (ref 96–112)
Creatinine, Ser: 0.72 mg/dL (ref 0.40–1.20)
GFR: 115.84 mL/min (ref >60.00)
Glucose, Bld: 81 mg/dL (ref 70–99)
Potassium: 3.7 mEq/L (ref 3.5–5.1)
Sodium: 140 mEq/L (ref 135–145)

## 2020-09-16 LAB — LIPID PANEL
Cholesterol: 183 mg/dL (ref 0–200)
HDL: 60.7 mg/dL (ref >39.00)
LDL Cholesterol: 107 mg/dL — ABNORMAL HIGH (ref 0–99)
NonHDL: 122.64
Total CHOL/HDL Ratio: 3
Triglycerides: 78 mg/dL (ref 0.0–149.0)
VLDL: 15.6 mg/dL (ref 0.0–40.0)

## 2020-09-16 LAB — CBC WITH DIFFERENTIAL/PLATELET
Basophils Absolute: 0.1 10*3/uL (ref 0.0–0.1)
Basophils Relative: 0.7 % (ref 0.0–3.0)
Eosinophils Absolute: 0.4 10*3/uL (ref 0.0–0.7)
Eosinophils Relative: 4.3 % (ref 0.0–5.0)
HCT: 39.3 % (ref 36.0–46.0)
Hemoglobin: 13.1 g/dL (ref 12.0–15.0)
Lymphocytes Relative: 32.5 % (ref 12.0–46.0)
Lymphs Abs: 3 10*3/uL (ref 0.7–4.0)
MCHC: 33.3 g/dL (ref 30.0–36.0)
MCV: 86.3 fl (ref 78.0–100.0)
Monocytes Absolute: 0.8 10*3/uL (ref 0.1–1.0)
Monocytes Relative: 8.6 % (ref 3.0–12.0)
Neutro Abs: 5 10*3/uL (ref 1.4–7.7)
Neutrophils Relative %: 53.9 % (ref 43.0–77.0)
Platelets: 257 10*3/uL (ref 150.0–400.0)
RBC: 4.56 Mil/uL (ref 3.87–5.11)
RDW: 13.7 % (ref 11.5–15.5)
WBC: 9.2 10*3/uL (ref 4.0–10.5)

## 2020-09-16 LAB — HEPATIC FUNCTION PANEL
ALT: 14 U/L (ref 0–35)
AST: 17 U/L (ref 0–37)
Albumin: 4.8 g/dL (ref 3.5–5.2)
Alkaline Phosphatase: 63 U/L (ref 39–117)
Bilirubin, Direct: 0.1 mg/dL (ref 0.0–0.3)
Total Bilirubin: 0.5 mg/dL (ref 0.2–1.2)
Total Protein: 7.6 g/dL (ref 6.0–8.3)

## 2020-09-16 MED ORDER — VIIBRYD STARTER PACK 10 & 20 MG PO KIT: 1.0000 | PACK | Freq: Every day | ORAL | 0 refills | Status: DC

## 2020-09-16 NOTE — Progress Notes (Signed)
Subjective:  Patient ID: Whitney Castro, female    DOB: April 25, 1996  Age: 25 y.o. MRN: [id number]  CC: Annual Exam and Depression  This visit occurred during the SARS-CoV-2 public health emergency.  Safety protocols were in place, including screening questions prior to the visit, additional usage of staff PPE, and extensive cleaning of exam room while observing appropriate contact time as indicated for disinfecting solutions.    HPI Jake Goodson presents for a CPX and to establish.  She abruptly stopped taking sertraline about 8 days ago.  She is experiencing multiple stressors.  She complains of weight gain, feeling overwhelmed, having trouble sleeping, fatigue, dizziness, and having tremors.  She does not feel worthless, helpless, hopeless, suicidal, or homicidal.  She has an 25-monthold and is a single mother.  The baby's father is not very supportive but his family is.  She also has support from her mother.  History Rashia has a past medical history of Anxiety, B12 deficiency, Medical history non-contributory, Ovarian cyst, and Vitamin D deficiency.   She has a past surgical history that includes Appendectomy and Ligament repair.   Her family history includes Healthy in her mother.She reports that she has never smoked. She has never used smokeless tobacco. She reports previous alcohol use. She reports that she does not use drugs.  Outpatient Medications Prior to Visit  Medication Sig Dispense Refill  . Prenatal Vit-Fe Fumarate-FA (MULTIVITAMIN-PRENATAL) 27-0.8 MG TABS tablet Take 1 tablet by mouth daily at 12 noon.    .Marland Kitchenacetaminophen (TYLENOL) 325 MG tablet Take 2 tablets (650 mg total) by mouth every 4 (four) hours as needed (for pain scale < 4). 30 tablet 1  . coconut oil OIL Apply 1 application topically as needed.  0  . ibuprofen (ADVIL) 600 MG tablet Take 1 tablet (600 mg total) by mouth every 6 (six) hours. 30 tablet 0  . triamcinolone cream (KENALOG) 0.1 % Apply 1 application topically  2 (two) times daily. 30 g 0  . sertraline (ZOLOFT) 50 MG tablet Take 50 mg by mouth daily.     No facility-administered medications prior to visit.    ROS Review of Systems  Constitutional: Positive for fatigue and unexpected weight change (wt gain). Negative for appetite change, chills and diaphoresis.  HENT: Negative.   Eyes: Negative.   Respiratory: Negative.  Negative for cough and shortness of breath.   Cardiovascular: Negative for chest pain.  Gastrointestinal: Negative for abdominal pain, constipation, diarrhea and nausea.  Endocrine: Negative for cold intolerance and heat intolerance.  Genitourinary: Negative.   Musculoskeletal: Negative.  Negative for arthralgias and myalgias.  Skin: Negative.   Neurological: Positive for dizziness. Negative for weakness and light-headedness.  Hematological: Negative for adenopathy. Does not bruise/bleed easily.  Psychiatric/Behavioral: Positive for dysphoric mood and sleep disturbance. Negative for behavioral problems, confusion, decreased concentration and suicidal ideas. The patient is not nervous/anxious.     Objective:  BP 116/70 (BP Location: Right Arm, Patient Position: Sitting, Cuff Size: Large)   Pulse 83   Temp 98 F (36.7 C) (Oral)   Resp 16   Ht '5\' 4"'  (1.626 m)   Wt 188 lb (85.3 kg)   SpO2 98%   BMI 32.27 kg/m   Physical Exam Vitals reviewed.  Constitutional:      Appearance: Normal appearance.  HENT:     Nose: Nose normal.     Mouth/Throat:     Mouth: Mucous membranes are moist.  Eyes:     General: No scleral icterus.  Conjunctiva/sclera: Conjunctivae normal.  Cardiovascular:     Rate and Rhythm: Normal rate and regular rhythm.     Heart sounds: No murmur heard.   Pulmonary:     Effort: Pulmonary effort is normal.     Breath sounds: No stridor. No wheezing, rhonchi or rales.  Abdominal:     General: Abdomen is flat. Bowel sounds are normal. There is no distension.     Palpations: Abdomen is soft. There  is no hepatomegaly, splenomegaly or mass.     Tenderness: There is no abdominal tenderness.  Musculoskeletal:        General: Normal range of motion.     Cervical back: Neck supple.     Right lower leg: No edema.     Left lower leg: No edema.  Lymphadenopathy:     Cervical: No cervical adenopathy.  Skin:    General: Skin is warm and dry.  Neurological:     General: No focal deficit present.     Mental Status: She is alert.  Psychiatric:        Attention and Perception: Attention and perception normal.        Mood and Affect: Mood is anxious and depressed. Affect is not tearful.        Speech: Speech normal.        Behavior: Behavior normal. Behavior is not agitated, slowed, aggressive, withdrawn, hyperactive or combative. Behavior is cooperative.        Thought Content: Thought content normal. Thought content is not paranoid or delusional. Thought content does not include homicidal or suicidal ideation.        Cognition and Memory: Cognition normal.     Lab Results  Component Value Date   WBC 9.2 09/16/2020   HGB 13.1 09/16/2020   HCT 39.3 09/16/2020   PLT 257.0 09/16/2020   GLUCOSE 81 09/16/2020   CHOL 183 09/16/2020   TRIG 78.0 09/16/2020   HDL 60.70 09/16/2020   LDLCALC 107 (H) 09/16/2020   ALT 14 09/16/2020   AST 17 09/16/2020   NA 140 09/16/2020   K 3.7 09/16/2020   CL 104 09/16/2020   CREATININE 0.72 09/16/2020   BUN 13 09/16/2020   CO2 27 09/16/2020   TSH 4.53 (H) 09/16/2020    Assessment & Plan:   Ilse was seen today for annual exam and depression.  Diagnoses and all orders for this visit:  Encounter for general adult medical examination with abnormal findings- Exam completed, labs reviewed, vaccines reviewed and updated, cancer screenings are up-to-date. -     Hepatitis C antibody; Future -     Lipid panel; Future -     Lipid panel -     Hepatitis C antibody  Chronic fatigue- Her labs are remarkable for mildly elevated TSH.  I have asked her to  return to be screened for autoimmune thyroid disease.  If she continues to have symptoms of hypothyroidism then I will recommend that she start T4. -     CBC with Differential/Platelet; Future -     Thyroid Panel With TSH; Future -     Hepatic function panel; Future -     Basic metabolic panel; Future -     Basic metabolic panel -     Hepatic function panel -     Thyroid Panel With TSH -     CBC with Differential/Platelet  Moderate episode of recurrent major depressive disorder ([place])- Her labs are suspicious for hypothyroidism.  Otherwise they are unremarkable.  I  recommend that she start treating this with vilazodone. -     CBC with Differential/Platelet; Future -     Thyroid Panel With TSH; Future -     Hepatic function panel; Future -     Basic metabolic panel; Future -     Vilazodone HCl (VIIBRYD STARTER PACK) 10 & 20 MG KIT; Take 1 tablet by mouth daily. -     Basic metabolic panel -     Hepatic function panel -     Thyroid Panel With TSH -     CBC with Differential/Platelet  TSH elevation -     Thyroid peroxidase antibody; Future   I have discontinued [place]'s sertraline, acetaminophen, ibuprofen, coconut oil, and triamcinolone cream. I am also having her start on Viibryd Starter Pack. Additionally, I am having her maintain her multivitamin-prenatal.  Meds ordered this encounter  Medications  . Vilazodone HCl (VIIBRYD STARTER PACK) 10 & 20 MG KIT    Sig: Take 1 tablet by mouth daily.    Dispense:  1 kit    Refill:  0     Follow-up: Return in about 3 months (around 12/17/2020).  [name], MD

## 2020-09-16 NOTE — Patient Instructions (Signed)
Major Depressive Disorder, Adult Major depressive disorder (MDD) is a mental health condition. It may also be called clinical depression or unipolar depression. MDD causes symptoms of sadness, hopelessness, and loss of interest in things. These symptoms last most of the day, almost every day, for 2 weeks. MDD can also cause physical symptoms. It can interfere with relationships and with everyday activities, such as work, school, and activities that are usually pleasant. MDD may be mild, moderate, or severe. It may be single-episode MDD, which happens once, or recurrent MDD, which may occur multiple times. What are the causes? The exact cause of this condition is not known. MDD is most likely caused by a combination of things, which may include:  Your personality traits.  Learned or conditioned behaviors or thoughts or feelings that reinforce negativity.  Any alcohol or substance misuse.  Long-term (chronic) physical or mental health illness.  Going through a traumatic experience or major life changes. What increases the risk? The following factors may make someone more likely to develop MDD:  A family history of depression.  Being a woman.  Troubled family relationships.  Abnormally low levels of certain brain chemicals.  Traumatic or painful events in childhood, especially abuse or loss of a parent.  A lot of stress from life experiences, such as poor living conditions or discrimination.  Chronic physical illness or other mental health disorders. What are the signs or symptoms? The main symptoms of MDD usually include:  Constant depressed or irritable mood.  A loss of interest in things and activities. Other symptoms include:  Sleeping or eating too much or too little.  Unexplained weight gain or weight loss.  Tiredness or low energy.  Being agitated, restless, or weak.  Feeling hopeless, worthless, or guilty.  Trouble thinking clearly or making  decisions.  Thoughts of suicide or thoughts of harming others.  Isolating oneself or avoiding other people or activities.  Trouble completing tasks, work, or any normal obligations. Severe symptoms of this condition may include:  Psychotic depression.This may include false beliefs, or delusions. It may also include seeing, hearing, tasting, smelling, or feeling things that are not real (hallucinations).  Chronic depression or persistent depressive disorder. This is low-level depression that lasts for at least 2 years.  Melancholic depression, or feeling extremely sad and hopeless.  Catatonic depression, which includes trouble speaking and trouble moving. How is this diagnosed? This condition may be diagnosed based on:  Your symptoms.  Your medical and mental health history. You may be asked questions about your lifestyle, including any drug and alcohol use.  A physical exam.  Blood tests to rule out other conditions. MDD is confirmed if you have the following symptoms most of the day, nearly every day, in a 2-week period:  Either a depressed mood or loss of interest.  At least four other MDD symptoms. How is this treated? This condition is usually treated by mental health professionals, such as psychologists, psychiatrists, and clinical social workers. You may need more than one type of treatment. Treatment may include:  Psychotherapy, also called talk therapy or counseling. Types of psychotherapy include: ? Cognitive behavioral therapy (CBT). This teaches you to recognize unhealthy feelings, thoughts, and behaviors, and replace them with positive thoughts and actions. ? Interpersonal therapy (IPT). This helps you to improve the way you communicate with others or relate to them. ? Family therapy. This treatment includes members of your family.  Medicines to treat anxiety and depression. These medicines help to balance the brain chemicals   that affect your emotions.  Lifestyle  changes. You may be asked to: ? Limit alcohol use and avoid drug use. ? Get regular exercise. ? Get plenty of sleep. ? Make healthy eating choices. ? Spend more time outdoors.  Brain stimulation. This may be done if symptoms are very severe and other treatments have not worked. Examples of this treatment are electroconvulsive therapy and transcranial magnetic stimulation. Follow these instructions at home: Activity  Exercise regularly and spend time outdoors.  Find activities that you enjoy doing, and make time to do them.  Find healthy ways to manage stress, such as: ? Meditation or deep breathing. ? Spending time in nature. ? Journaling.  Return to your normal activities as told by your health care provider. Ask your health care provider what activities are safe for you. Alcohol and drug use  If you drink alcohol: ? Limit how much you use to:  0-1 drink a day for women who are not pregnant.  0-2 drinks a day for men. ? Be aware of how much alcohol is in your drink. In the [place] one drink equals one 12 oz bottle of beer (355 mL), one 5 oz glass of wine (148 mL), or one 1 oz glass of hard liquor (44 mL). ? Discuss your alcohol use with your health care provider. Alcohol can affect any antidepressant medicines you are taking.  Discuss any drug use with your health care provider. General instructions  Take over-the-counter and prescription medicines only as told by your health care provider.  Eat a healthy diet and get plenty of sleep.  Consider joining a support group. Your health care provider may be able to recommend one.  Keep all follow-up visits as told by your health care provider. This is important.   Where to find more information  National Alliance on Mental Illness: www.nami.org  [place] National Institute of Mental Health: www.nimh.nih.gov Contact a health care provider if:  Your symptoms get worse.  You develop new symptoms. Get help right away if:  You  self-harm.  You have serious thoughts about hurting yourself or others.  You hallucinate. If you ever feel like you may hurt yourself or others, or have thoughts about taking your own life, get help right away. Go to your nearest emergency department or:  Call your local emergency services (911 in the [place]  Call a suicide crisis helpline, such as the National Suicide Prevention Lifeline at 1-800-273-8255. This is open 24 hours a day in the [place]  Text the Crisis Text Line at 741741 (in the [place] Summary  Major depressive disorder (MDD) is a mental health condition. MDD causes symptoms of sadness, hopelessness, and loss of interest in things. These symptoms last most of the day, almost every day, for 2 weeks.  The symptoms of MDD can interfere with relationships and with everyday activities.  Treatments and support are available for people who develop MDD. You may need more than one type of treatment.  Get help right away if you have serious thoughts about hurting yourself or others. This information is not intended to replace advice given to you by your health care provider. Make sure you discuss any questions you have with your health care provider. Document Revised: 03/30/2019 Document Reviewed: 03/30/2019 Elsevier Patient Education  2021 Elsevier Inc.  

## 2020-09-17 DIAGNOSIS — R7989 Other specified abnormal findings of blood chemistry: Secondary | ICD-10-CM | POA: Insufficient documentation

## 2020-09-17 LAB — THYROID PANEL WITH TSH
Free Thyroxine Index: 2.3 (ref 1.4–3.8)
T3 Uptake: 30 % (ref 22–35)
T4, Total: 7.8 ug/dL (ref 5.1–11.9)
TSH: 4.53 mIU/L — ABNORMAL HIGH

## 2020-09-17 LAB — HEPATITIS C ANTIBODY
Hepatitis C Ab: NONREACTIVE
SIGNAL TO CUT-OFF: 0.09 (ref <1.00)

## 2020-09-20 ENCOUNTER — Other Ambulatory Visit: Payer: Self-pay

## 2020-09-20 ENCOUNTER — Emergency Department (HOSPITAL_BASED_OUTPATIENT_CLINIC_OR_DEPARTMENT_OTHER)
Admission: EM | Admit: 2020-09-20 | Discharge: 2020-09-20 | Disposition: A | Discharge status: Home / Self Care | Payer: No Typology Code available for payment source | Attending: Emergency Medicine | Admitting: Emergency Medicine

## 2020-09-20 ENCOUNTER — Encounter (HOSPITAL_BASED_OUTPATIENT_CLINIC_OR_DEPARTMENT_OTHER): Payer: Self-pay | Admitting: Obstetrics and Gynecology

## 2020-09-20 ENCOUNTER — Emergency Department (HOSPITAL_BASED_OUTPATIENT_CLINIC_OR_DEPARTMENT_OTHER): Payer: No Typology Code available for payment source | Admitting: Radiology

## 2020-09-20 DIAGNOSIS — R0789 Other chest pain: Secondary | ICD-10-CM | POA: Insufficient documentation

## 2020-09-20 DIAGNOSIS — Z20822 Contact with and (suspected) exposure to covid-19: Secondary | ICD-10-CM | POA: Diagnosis not present

## 2020-09-20 DIAGNOSIS — R0600 Dyspnea, unspecified: Secondary | ICD-10-CM | POA: Diagnosis not present

## 2020-09-20 DIAGNOSIS — R079 Chest pain, unspecified: Secondary | ICD-10-CM

## 2020-09-20 LAB — D-DIMER, QUANTITATIVE: D-Dimer, Quant: <0.27 ug/mL-FEU (ref 0.00–0.50)

## 2020-09-20 LAB — CBC
HCT: 41.9 % (ref 36.0–46.0)
Hemoglobin: 13.9 g/dL (ref 12.0–15.0)
MCH: 28.6 pg (ref 26.0–34.0)
MCHC: 33.2 g/dL (ref 30.0–36.0)
MCV: 86.2 fL (ref 80.0–100.0)
Platelets: 299 10*3/uL (ref 150–400)
RBC: 4.86 MIL/uL (ref 3.87–5.11)
RDW: 12.9 % (ref 11.5–15.5)
WBC: 11.9 10*3/uL — ABNORMAL HIGH (ref 4.0–10.5)
nRBC: 0 % (ref 0.0–0.2)

## 2020-09-20 LAB — PREGNANCY, URINE: Preg Test, Ur: NEGATIVE

## 2020-09-20 LAB — BASIC METABOLIC PANEL
Anion gap: 9 (ref 5–15)
BUN: 16 mg/dL (ref 6–20)
CO2: 26 mmol/L (ref 22–32)
Calcium: 9.2 mg/dL (ref 8.9–10.3)
Chloride: 104 mmol/L (ref 98–111)
Creatinine, Ser: 0.77 mg/dL (ref 0.44–1.00)
GFR, Estimated: >60 mL/min (ref >60)
Glucose, Bld: 86 mg/dL (ref 70–99)
Potassium: 3.9 mmol/L (ref 3.5–5.1)
Sodium: 139 mmol/L (ref 135–145)

## 2020-09-20 LAB — RESP PANEL BY RT-PCR (FLU A&B, COVID) ARPGX2
Influenza A by PCR: NEGATIVE
Influenza B by PCR: NEGATIVE
SARS Coronavirus 2 by RT PCR: NEGATIVE

## 2020-09-20 LAB — TROPONIN I (HIGH SENSITIVITY)
Troponin I (High Sensitivity): <2 ng/L (ref <18)
Troponin I (High Sensitivity): <2 ng/L (ref <18)

## 2020-09-20 NOTE — ED Triage Notes (Signed)
Patient reports to the ER for chest pain and SOB. Patient reports she has trouble walking around. Patient reports she burped, peed, pooped, and nothing has helped.

## 2020-09-20 NOTE — ED Provider Notes (Signed)
[facility] DE[facility] Provider Note   CSN: [id number] Arrival date & time: 09/20/20  1410     History Chief Complaint  Patient presents with  . Chest Pain  . Shortness of Breath    Whitney Castro is a 25 y.o. female.  HPI      Presents with chest pain Woke up out of sleep, sharp pain in left chest Thought was gas , tried to take a nap but couldn't lay on left side, got up and go to the store but felt short of breath walking around store. At rest is ok, but when walking around feels short of breath Worse when on left side, dull ache right now, when walking is a stabbing pain and when taking a deep breath in is stabbing pain as well No leg pain or swelling No recent surgeries, immobilization, trips No hx of DVT, PE IUD Started metamucil because cholesterol a littl ehigh at dr. Prescribed new antidepressant but hasnt taken it No smoking or early immediate fam hx of CAD   Past Medical History:  Diagnosis Date  . Anxiety   . B12 deficiency   . Medical history non-contributory   . Ovarian cyst   . Vitamin D deficiency     Patient Active Problem List   Diagnosis Date Noted  . TSH elevation 09/17/2020  . Encounter for general adult medical examination with abnormal findings 09/16/2020  . Chronic fatigue 09/16/2020  . Moderate episode of recurrent major depressive disorder ([place]) 09/16/2020    Past Surgical History:  Procedure Laterality Date  . APPENDECTOMY    . LIGAMENT REPAIR     knee and elbow     OB History    Gravida  1   Para  1   Term  1   Preterm      AB      Living  1     SAB      IAB      Ectopic      Multiple  0   Live Births  1           Family History  Problem Relation Age of Onset  . Healthy Mother     Social History   Tobacco Use  . Smoking status: Never Smoker  . Smokeless tobacco: Never Used  Vaping Use  . Vaping Use: Never used  Substance Use Topics  . Alcohol use: Not Currently  . Drug use:  Never    Home Medications Prior to Admission medications   Medication Sig Start Date End Date Taking? Authorizing Provider  Prenatal Vit-Fe Fumarate-FA (MULTIVITAMIN-PRENATAL) 27-0.8 MG TABS tablet Take 1 tablet by mouth daily at 12 noon.    [provider]  Vilazodone HCl (VIIBRYD STARTER PACK) 10 & 20 MG KIT Take 1 tablet by mouth daily. 09/16/20   Janith Lima, MD    Allergies    Patient has no known allergies.  Review of Systems   Review of Systems  Constitutional: Negative for fever.  HENT: Negative for sore throat.   Eyes: Negative for visual disturbance.  Respiratory: Positive for shortness of breath. Negative for cough.   Cardiovascular: Positive for chest pain.  Gastrointestinal: Negative for abdominal pain, diarrhea, nausea and vomiting.  Genitourinary: Negative for difficulty urinating.  Musculoskeletal: Negative for back pain and neck pain.  Skin: Negative for rash.  Neurological: Positive for light-headedness. Negative for syncope and headaches.    Physical Exam Updated Vital Signs BP 123/72 (BP Location: Right Arm)  Pulse 82   Temp 98.3 F (36.8 C) (Oral)   Resp 18   SpO2 100%   Breastfeeding Yes   Physical Exam Vitals and nursing note reviewed.  Constitutional:      General: She is not in acute distress.    Appearance: She is well-developed. She is not diaphoretic.  HENT:     Head: Normocephalic and atraumatic.  Eyes:     Conjunctiva/sclera: Conjunctivae normal.  Cardiovascular:     Rate and Rhythm: Normal rate and regular rhythm.     Heart sounds: Normal heart sounds. No murmur heard. No friction rub. No gallop.   Pulmonary:     Effort: Pulmonary effort is normal. No respiratory distress.     Breath sounds: Normal breath sounds. No wheezing or rales.  Chest:     Chest wall: Tenderness present.  Abdominal:     General: There is no distension.     Palpations: Abdomen is soft.     Tenderness: There is no abdominal tenderness. There is  no guarding.  Musculoskeletal:        General: No tenderness.     Cervical back: Normal range of motion.  Skin:    General: Skin is warm and dry.     Findings: No erythema or rash.  Neurological:     Mental Status: She is alert and oriented to person, place, and time.     ED Results / Procedures / Treatments   Labs (all labs ordered are listed, but only abnormal results are displayed) Labs Reviewed  CBC - Abnormal; Notable for the following components:      Result Value   WBC 11.9 (*)    All other components within normal limits  RESP PANEL BY RT-PCR (FLU A&B, COVID) [id number]  BASIC METABOLIC PANEL  PREGNANCY, URINE  D-DIMER, QUANTITATIVE  TROPONIN I (HIGH SENSITIVITY)  TROPONIN I (HIGH SENSITIVITY)    EKG EKG Interpretation  Date/Time:  'Sunday Sep 20 2020 14:20:42 EDT Ventricular Rate:  92 PR Interval:  156 QRS Duration: 84 QT Interval:  340 QTC Calculation: 420 R Axis:   80 Text Interpretation: Normal sinus rhythm Normal ECG No previous ECGs available Confirmed by Kalee Broxton ([id number]) on 09/20/2020 4:50:03 PM   Radiology DG Chest 2 View  Result Date: 09/20/2020 CLINICAL DATA:  Chest pain, shortness of breath EXAM: CHEST - 2 VIEW COMPARISON:  None. FINDINGS: Heart and mediastinal contours are within normal limits. No focal opacities or effusions. No acute bony abnormality. IMPRESSION: No active cardiopulmonary disease. Electronically Signed   By: Kevin  Dover M.D.   On: 09/20/2020 14:52    Procedures Procedures   Medications Ordered in ED Medications - No data to display  ED Course  I have reviewed the triage vital signs and the nursing notes.  Pertinent labs & imaging results that were available during my care of the patient were reviewed by me and considered in my medical decision making (see chart for details).    MDM Rules/Calculators/A&P                         24' yo female presents with concern for chest pain and dyspnea.  Differential diagnosis for  chest pain includes pulmonary embolus, dissection, pneumothorax, pneumonia, ACS, myocarditis, pericarditis.  EKG was done and evaluate by me and showed no acute ST changes and no signs of pericarditis. Chest x-ray was done and evaluated by me and radiology and showed no sign of pneumonia or pneumothorax. Patient  is low risk [place] with a negative ddimer and have low suspicion for PE.  Patient is low risk HEART score and had delta troponins which were both negative.  Do not feel history or exam are consistent with aortic dissection.   Possible musculoskeletal chest pain given pain with palpation. Patient discharged in stable condition with understanding of reasons to return and recommend PCP follow up.   Final Clinical Impression(s) / ED Diagnoses Final diagnoses:  Chest pain, unspecified type    Rx / DC Orders ED Discharge Orders    None       Gareth Morgan, MD 09/22/20 1118

## 2020-10-07 ENCOUNTER — Other Ambulatory Visit: Payer: No Typology Code available for payment source

## 2020-10-07 ENCOUNTER — Other Ambulatory Visit: Payer: Self-pay

## 2020-10-07 DIAGNOSIS — R7989 Other specified abnormal findings of blood chemistry: Secondary | ICD-10-CM

## 2020-10-09 LAB — THYROID PEROXIDASE ANTIBODY: Thyroperoxidase Ab SerPl-aCnc: 1 IU/mL (ref <9)

## 2020-11-05 ENCOUNTER — Other Ambulatory Visit: Payer: Self-pay

## 2020-11-05 ENCOUNTER — Ambulatory Visit (INDEPENDENT_AMBULATORY_CARE_PROVIDER_SITE_OTHER): Payer: No Typology Code available for payment source | Admitting: Internal Medicine

## 2020-11-05 ENCOUNTER — Encounter: Payer: Self-pay | Admitting: Internal Medicine

## 2020-11-05 VITALS — BP 122/84 | HR 84 | Temp 98.2°F | Resp 16 | Ht 64.0 in | Wt 182.0 lb

## 2020-11-05 DIAGNOSIS — M659 Synovitis and tenosynovitis, unspecified: Secondary | ICD-10-CM | POA: Diagnosis not present

## 2020-11-05 DIAGNOSIS — R7989 Other specified abnormal findings of blood chemistry: Secondary | ICD-10-CM | POA: Diagnosis not present

## 2020-11-05 LAB — CBC WITH DIFFERENTIAL/PLATELET
Basophils Absolute: 0.1 10*3/uL (ref 0.0–0.1)
Basophils Relative: 0.9 % (ref 0.0–3.0)
Eosinophils Absolute: 0.5 10*3/uL (ref 0.0–0.7)
Eosinophils Relative: 5.1 % — ABNORMAL HIGH (ref 0.0–5.0)
HCT: 41.9 % (ref 36.0–46.0)
Hemoglobin: 13.9 g/dL (ref 12.0–15.0)
Lymphocytes Relative: 32.5 % (ref 12.0–46.0)
Lymphs Abs: 3.2 10*3/uL (ref 0.7–4.0)
MCHC: 33.1 g/dL (ref 30.0–36.0)
MCV: 86.9 fl (ref 78.0–100.0)
Monocytes Absolute: 0.8 10*3/uL (ref 0.1–1.0)
Monocytes Relative: 8.3 % (ref 3.0–12.0)
Neutro Abs: 5.2 10*3/uL (ref 1.4–7.7)
Neutrophils Relative %: 53.2 % (ref 43.0–77.0)
Platelets: 279 10*3/uL (ref 150.0–400.0)
RBC: 4.82 Mil/uL (ref 3.87–5.11)
RDW: 13.8 % (ref 11.5–15.5)
WBC: 9.7 10*3/uL (ref 4.0–10.5)

## 2020-11-05 NOTE — Patient Instructions (Signed)
De Quervain's Tenosynovitis De Quervain's tenosynovitis is a condition that causes inflammation of the tendon on the thumb side of the wrist. Tendons are cords of tissue that connect bones to muscles. The tendons in the hand pass through a tunnel called a sheath. A slippery layer of tissue (synovium) lets the tendons move smoothly in the sheath. With de Quervain's tenosynovitis, the sheath swells or thickens, causing friction and pain. The condition is also called de Quervain's disease and de Quervain's syndrome. It occurs most often in women who are 26-26 years old. What are the causes? The exact cause of this condition is not known. It may be associated with overuse of the hand and wrist. What increases the risk? You are more likely to develop this condition if you: Use your hands far more than normal, especially if you repeat certain movements that involve twisting your hand or using a tight grip. Are pregnant. Are a middle-aged woman. Have rheumatoid arthritis. Have diabetes. What are the signs or symptoms? The main symptom of this condition is pain on the thumb side of the wrist. The pain may get worse when you grasp something or turn your wrist. Other symptoms may include: Pain that extends up the forearm. Swelling of your wrist and hand. Trouble moving the thumb and wrist. A sensation of snapping in the wrist. A bump filled with fluid (cyst) in the area of the pain. How is this diagnosed? This condition may be diagnosed based on: Your symptoms and medical history. A physical exam. During the exam, your health care provider may do a simple test (Finkelstein test) that involves pulling your thumb and wrist to see if this causes pain. You may also need to have an X-ray or ultrasound. How is this treated? Treatment for this condition may include: Avoiding any activity that causes pain and swelling. Taking medicines. Anti-inflammatory medicines and corticosteroid injections may be used  to reduce inflammation and relieve pain. Wearing a splint. Having surgery. This may be needed if other treatments do not work. Once the pain and swelling have gone down, you may start: Physical therapy. This includes exercises to improve movement and strength in your wrist and thumb. Occupational therapy. This includes adjusting how you move your wrist. Follow these instructions at home: If you have a splint: Wear the splint as told by your health care provider. Remove it only as told by your health care provider. Loosen the splint if your fingers tingle, become numb, or turn cold and blue. Keep the splint clean. If the splint is not waterproof: Do not let it get wet. Cover it with a watertight covering when you take a bath or a shower. Managing pain, stiffness, and swelling  Avoid movements and activities that cause pain and swelling in the wrist area. If directed, put ice on the painful area. This may be helpful after doing activities that involve the sore wrist. To do this: Put ice in a plastic bag. Place a towel between your skin and the bag. Leave the ice on for 20 minutes, 2-3 times a day. Remove the ice if your skin turns bright red. This is very important. If you cannot feel pain, heat, or cold, you have a greater risk of damage to the area. Move your fingers often to reduce stiffness and swelling. Raise (elevate) the injured area above the level of your heart while you are sitting or lying down. General instructions Return to your normal activities as told by your health care provider. Ask your health   care provider what activities are safe for you. Take over-the-counter and prescription medicines only as told by your health care provider. Keep all follow-up visits. This is important. Contact a health care provider if: Your pain medicine does not help. Your pain gets worse. You develop new symptoms. Summary De Quervain's tenosynovitis is a condition that causes inflammation  of the tendon on the thumb side of the wrist. The condition occurs most often in women who are 26-26 years old. The exact cause of this condition is not known. It may be associated with overuse of the hand and wrist. Treatment starts with avoiding activity that causes pain or swelling in the wrist area. Other treatments may include wearing a splint and taking medicine. Sometimes, surgery is needed. This information is not intended to replace advice given to you by your health care provider. Make sure you discuss any questions you have with your health care provider. Document Revised: 07/31/2019 Document Reviewed: 07/31/2019 Elsevier Patient Education  2022 [employer]  

## 2020-11-05 NOTE — Progress Notes (Signed)
Tenosynovitis   Subjective:  Patient ID: Whitney Castro, female    DOB: [birth year]  Age: 25 y.o. MRN: [id number]  CC: Wrist Pain  This visit occurred during the SARS-CoV-2 public health emergency.  Safety protocols were in place, including screening questions prior to the visit, additional usage of staff PPE, and extensive cleaning of exam room while observing appropriate contact time as indicated for disinfecting solutions.    HPI Whitney Castro presents for f/up -  She returns to have her TSH level rechecked.  She continues to complain of fatigue, weight gain, and insomnia.  She is decided to take sertraline instead of Viibryd.  She complains of a remote history of right wrist pain that returned about 2 weeks ago.  She denies any recent trauma or injury.  She said she previously saw an orthopedist and it was recommended that she get a steroid injection in her wrist.  Outpatient Medications Prior to Visit  Medication Sig Dispense Refill   Prenatal Vit-Fe Fumarate-FA (MULTIVITAMIN-PRENATAL) 27-0.8 MG TABS tablet Take 1 tablet by mouth daily at 12 noon.     Vilazodone HCl (VIIBRYD STARTER PACK) 10 & 20 MG KIT Take 1 tablet by mouth daily. 1 kit 0   No facility-administered medications prior to visit.    ROS Review of Systems  Constitutional:  Positive for fatigue and unexpected weight change. Negative for diaphoresis.  HENT: Negative.    Eyes: Negative.   Respiratory:  Negative for cough and shortness of breath.   Gastrointestinal:  Negative for abdominal pain, constipation, diarrhea, nausea and vomiting.  Endocrine: Negative.  Negative for cold intolerance and heat intolerance.  Genitourinary: Negative.   Musculoskeletal:  Positive for arthralgias. Negative for back pain and myalgias.  Neurological: Negative.   Hematological:  Negative for adenopathy. Does not bruise/bleed easily.  Psychiatric/Behavioral:  Positive for sleep disturbance. Negative for behavioral problems and dysphoric mood.  The patient is not nervous/anxious.    Objective:  BP 122/84 (BP Location: Right Arm, Patient Position: Sitting, Cuff Size: Large)   Pulse 84   Temp 98.2 F (36.8 C) (Rectal)   Resp 16   Ht '5\' 4"'  (1.626 m)   Wt 182 lb (82.6 kg)   SpO2 97%   BMI 31.24 kg/m   BP Readings from Last 3 Encounters:  11/05/20 122/84  09/20/20 123/72  09/16/20 116/70    Wt Readings from Last 3 Encounters:  11/05/20 182 lb (82.6 kg)  09/16/20 188 lb (85.3 kg)  11/15/19 194 lb 8 oz (88.2 kg)    Physical Exam Vitals reviewed.  Constitutional:      Appearance: Normal appearance.  HENT:     Nose: Nose normal.     Mouth/Throat:     Mouth: Mucous membranes are moist.  Eyes:     General: No scleral icterus.    Conjunctiva/sclera: Conjunctivae normal.  Cardiovascular:     Rate and Rhythm: Normal rate and regular rhythm.     Heart sounds: No murmur heard. Pulmonary:     Effort: Pulmonary effort is normal.     Breath sounds: No stridor. No wheezing, rhonchi or rales.  Abdominal:     General: Abdomen is flat.     Palpations: There is no mass.     Tenderness: There is no abdominal tenderness.  Musculoskeletal:        General: Tenderness present. No swelling.     Right wrist: Bony tenderness present. No deformity, effusion, tenderness or crepitus. Normal range of motion.     Cervical  back: Neck supple.     Comments: Ttp over the over the radial styloid  Lymphadenopathy:     Cervical: No cervical adenopathy.  Skin:    General: Skin is warm and dry.  Neurological:     General: No focal deficit present.     Mental Status: She is alert.    Lab Results  Component Value Date   WBC 9.7 11/05/2020   HGB 13.9 11/05/2020   HCT 41.9 11/05/2020   PLT 279.0 11/05/2020   GLUCOSE 86 09/20/2020   CHOL 183 09/16/2020   TRIG 78.0 09/16/2020   HDL 60.70 09/16/2020   LDLCALC 107 (H) 09/16/2020   ALT 14 09/16/2020   AST 17 09/16/2020   NA 139 09/20/2020   K 3.9 09/20/2020   CL 104 09/20/2020    CREATININE 0.77 09/20/2020   BUN 16 09/20/2020   CO2 26 09/20/2020   TSH 3.18 11/05/2020    DG Chest 2 View  Result Date: 09/20/2020 CLINICAL DATA:  Chest pain, shortness of breath EXAM: CHEST - 2 VIEW COMPARISON:  None. FINDINGS: Heart and mediastinal contours are within normal limits. No focal opacities or effusions. No acute bony abnormality. IMPRESSION: No active cardiopulmonary disease. Electronically Signed   By: Rolm Baptise M.D.   On: 09/20/2020 14:52    Assessment & Plan:   Whitney Castro was seen today for wrist pain.  Diagnoses and all orders for this visit:  TSH elevation- Her TFT's are normal now. -     Thyroid Panel With TSH; Future -     CBC with Differential/Platelet; Future -     CBC with Differential/Platelet -     Thyroid Panel With TSH  Tenosynovitis of right wrist -     CBC with Differential/Platelet; Future -     Ambulatory referral to Sports Medicine -     CBC with Differential/Platelet  I have discontinued Whitney Castro's Viibryd Whitney Castro. I am also having her maintain her multivitamin-prenatal.  No orders of the defined types were placed in this encounter.    Follow-up: Return in about 3 months (around 02/05/2021).  Whitney Calico, MD

## 2020-11-06 LAB — THYROID PANEL WITH TSH
Free Thyroxine Index: 2.1 (ref 1.4–3.8)
T3 Uptake: 30 % (ref 22–35)
T4, Total: 7.1 ug/dL (ref 5.1–11.9)
TSH: 3.18 mIU/L

## 2020-11-09 ENCOUNTER — Other Ambulatory Visit: Payer: Self-pay

## 2020-11-09 ENCOUNTER — Encounter: Payer: Self-pay | Admitting: Family Medicine

## 2020-11-09 ENCOUNTER — Ambulatory Visit: Payer: No Typology Code available for payment source | Admitting: Family Medicine

## 2020-11-09 VITALS — BP 120/78 | Ht 65.0 in | Wt 183.0 lb

## 2020-11-09 DIAGNOSIS — M654 Radial styloid tenosynovitis [de Quervain]: Secondary | ICD-10-CM | POA: Diagnosis not present

## 2020-11-09 MED ORDER — METHYLPREDNISOLONE ACETATE 40 MG/ML IJ SUSP
20.0000 mg | Freq: Once | INTRAMUSCULAR | Status: AC
  Administered 2020-11-09: 20 mg via INTRA_ARTICULAR

## 2020-11-09 NOTE — Progress Notes (Signed)
   Whitney Castro is a 25 y.o. female who presents to [facility] today for the following:  Right thumb pain Has been present for 3 weeks with sudden onset History of carpal tunnel in pregnancy, currently has a 27-year-old she lifts consistently. Patient works as a Clinical research associate pain with movement that is tight and sharp and worsening Has used using carpal tunnel brace at night with some improvement but is too restricting for her job and taking care of her child Was using Tylenol ibuprofen but with minimal improvement and has not taken in several weeks Is not using ice or heat on the area   PMH reviewed.  ROS as above. Medications reviewed.  Exam:  BP 120/78   Ht 5\' 5"  (1.651 m)   Wt 183 lb (83 kg)   BMI 30.45 kg/m  Gen: Well NAD MSK:  Right hand: Inspection: No obvious deformity. No swelling, erythema or bruising Palpation: Significantly TTP 1st dorsal compartment.  No 1st CMC, carpal tunnel tenderness. ROM: Full ROM of the wrist, decreased ROM of the right thumb. Fully able to extend and flex other 4 fingers. Strength: 5/5 strength in the forearm, wrist and interosseus muscles Neurovascular: NV intact Special tests: Positive finkelstein's, negative tinel's at the carpal tunnel, negative Phalen's and reverse Phalen's, though noted tenderness at the radial styloid with reverse Phalen's  Fingers:  No swelling in PIP, DIP joints. Flexor digitorum profundus and superficialis tendon functions are intact.  PIP joint collateral ligaments are stable   Assessment and Plan: 1) deQuervain's tenosynovitis - discussed options and went ahead with injection today.  Icing.  Consider thumb spica brace.  F/u prn.  After informed written consent timeout was performed.  Patient was seated on exam table.  Area overlying right 1st dorsal compartment prepped with alcohol swab then utilizing ultrasound guidance, patient's right 1st dorsal compartment of the wrist injected with 0.5:0.17mL lidocaine: depomedrol.   Patient tolerated procedure well without immediate complications.   Bohdi Leeds, DO

## 2020-11-09 NOTE — Assessment & Plan Note (Signed)
Positive Finkelstein's, physical exam consistent with de Quervain's tenosynovitis. -Steroid injection given today -Consider thumb spica brace -Ice for 15 minutes 3-4 times per day -Follow-up as needed or if not improved in the next several weeks

## 2020-11-09 NOTE — Patient Instructions (Signed)
You have deQuervain's tenosynovitis of your thumb/wrist. Avoid painful activities as much as possible. Consider thumb spica brace - often this is not needed since we did the injection. Ice 15 minutes at a time 3-4 times a day. A cortisone injection typically helps a great deal with this and is an option - you were given this today. Follow up with me as needed for reevaluation.

## 2020-11-14 ENCOUNTER — Ambulatory Visit (HOSPITAL_COMMUNITY)
Admission: EM | Admit: 2020-11-14 | Discharge: 2020-11-14 | Disposition: A | Discharge status: Home / Self Care | Payer: No Typology Code available for payment source | Attending: Family | Admitting: Family

## 2020-11-14 ENCOUNTER — Other Ambulatory Visit: Payer: Self-pay

## 2020-11-14 DIAGNOSIS — F332 Major depressive disorder, recurrent severe without psychotic features: Secondary | ICD-10-CM | POA: Diagnosis not present

## 2020-11-14 MED ORDER — HYDROXYZINE HCL 25 MG PO TABS: 50.0000 mg | ORAL_TABLET | Freq: Every evening | ORAL | Status: DC | PRN

## 2020-11-14 MED ORDER — HYDROXYZINE HCL 50 MG PO TABS: 50.0000 mg | ORAL_TABLET | Freq: Every evening | ORAL | 0 refills | Status: AC | PRN

## 2020-11-14 NOTE — Progress Notes (Signed)
   11/14/20 1615  [facility] Triage Screening (Walk-ins at [facility] only)  How Did You Hear About [place]? Self  What Is the Reason for Your Visit/Call Today? Patient is a Engineer, civil (consulting), who presents reporting worsening post partum depression with passive SI.  She denies psych history prior to having her baby who is 26 y.o. now.  She denies current SI, plan or intent and was hoping to speak with someone about her zoloft that she feels is minimally effective.  Patient is open to discussing other treatment options as well.  How Long Has This Been Causing You Problems? > than 6 months  Have You Recently Had Any Thoughts About Hurting Yourself? Yes  How long ago did you have thoughts about hurting yourself? passive SI, no plan or intent and multiple protective factors  Are You Planning to Commit Suicide/Harm Yourself At This time? No  Have you Recently Had Thoughts About Hurting Someone [name]? No  Are You Planning To Harm Someone At This Time? No  Are you currently experiencing any auditory, visual or other hallucinations? No  Have You Used Any Alcohol or Drugs in the Past 24 Hours? No  Do you have any current medical co-morbidities that require immediate attention? No  Clinician description of patient physical appearance/behavior: calm, cooperative and pleasant  What Do You Feel Would Help You the Most Today? Treatment for Depression or other mood problem  If access to [facility] was not available, would you have sought care in the Emergency Department? No  Determination of Need Routine (7 days)  Options For Referral Intensive Outpatient Therapy;Medication Management;Partial Hospitalization;Outpatient Therapy

## 2020-11-14 NOTE — Discharge Instructions (Addendum)
Take all medications as prescribed. Keep all follow-up appointments as scheduled.  Do not consume alcohol or use illegal drugs while on prescription medications. Report any adverse effects from your medications to your primary care provider promptly.  In the event of recurrent symptoms or worsening symptoms, call 911, a crisis hotline, or go to the nearest emergency department for evaluation.   

## 2020-11-14 NOTE — ED Provider Notes (Signed)
Behavioral Health Urgent Care Medical Screening Exam  Patient Name: Whitney Castro MRN: [id number] Date of Evaluation: 11/14/20 Chief Complaint:   Diagnosis:  Final diagnoses:  Severe episode of recurrent major depressive disorder, without psychotic features (HCC)    History of Present illness: Whitney Castro is a 25 y.o. female.  Presents to [facility] urgent care after she states she was referred by her counselor. Reported she has been followed by  employee assistance Program (EAP).  States history with postpartum depression and she  feels as her depression is getting worse. Stated that she is not able to sleep well at night.  She is currently denying suicidal or homicidal ideations.  Denies auditory or visual hallucinations.    Does report suicidal ideations about 1 year prior.  Denied history with plan or intent.  States she was initiated on Zoloft and is currently taken Zoloft 100 mg daily. Reported taking and tolerating medications well however feels that she needs something more to help with her depressive symptoms.  Discussed patient following up with intensive outpatient programming.  We will make hydroxyzine 50 mg p.o. as needed nightly available.  Patient should to follow-up with partial hospitalization programming.  Patient was receptive to plan.  Support ,encouragement and reassurance was provided  Psychiatric Specialty Exam  Presentation  General Appearance:Appropriate for Environment  Eye Contact:Fair  Speech:Clear and Coherent  Speech Volume:Normal  Handedness:Right   Mood and Affect  Mood:Anxious  Affect:Non-Congruent   Thought Process  Thought Processes:Goal Directed  Descriptions of Associations:Intact  Orientation:Full (Time, Place and Person)  Thought Content:Logical    Hallucinations:None  Ideas of Reference:None  Suicidal Thoughts:No  Homicidal Thoughts:No   Sensorium  Memory:Immediate Fair; Recent Fair; Remote  Fair  Judgment:Fair  Insight: No data recorded  Executive Functions  Concentration:Fair  Attention Span:Good  Recall:Good  Fund of Knowledge:Good  Language:Good   Psychomotor Activity  Psychomotor Activity:Normal   Assets  Assets:Social Support   Sleep  Sleep:Fair  Number of hours:  No data recorded  Nutritional Assessment (For OBS and [facility] admissions only) Has the patient had a weight loss or gain of 10 pounds or more in the last 3 months?: No Has the patient had a decrease in food intake/or appetite?: No Does the patient have dental problems?: No Does the patient have eating habits or behaviors that may be indicators of an eating disorder including binging or inducing vomiting?: No Has the patient recently lost weight without trying?: No Has the patient been eating poorly because of a decreased appetite?: No Malnutrition Screening Tool Score: 0   Physical Exam: Physical Exam HENT:     Head: Normocephalic.  Eyes:     Pupils: Pupils are equal, round, and reactive to light.  Cardiovascular:     Rate and Rhythm: Normal rate and regular rhythm.  Pulmonary:     Effort: Pulmonary effort is normal.  Musculoskeletal:     Cervical back: Normal range of motion.  Neurological:     Mental Status: She is alert and oriented to person, place, and time.  Psychiatric:        Attention and Perception: Attention and perception normal.        Mood and Affect: Mood normal.        Speech: Speech normal.        Behavior: Behavior normal. Behavior is cooperative.        Thought Content: Thought content normal.        Cognition and Memory: Cognition and memory normal.  Judgment: Judgment normal.   Review of Systems  HENT: Negative.    Gastrointestinal: Negative.   Genitourinary: Negative.   Skin: Negative.   Neurological: Negative.   Psychiatric/Behavioral:  Positive for depression. Negative for substance abuse and suicidal ideas. The patient is nervous/anxious  and has insomnia.   All other systems reviewed and are negative. Blood pressure 114/84, pulse 79, temperature 99.3 F (37.4 C), temperature source Oral, resp. rate 16, SpO2 99 %, currently breastfeeding. There is no height or weight on file to calculate BMI.  Musculoskeletal: Strength & Muscle Tone: within normal limits Gait & Station: normal Patient leans: N/A   [facility] MSE Discharge Disposition for Follow up and Recommendations: Based on my evaluation the patient does not appear to have an emergency medical condition and can be discharged with resources and follow up care in outpatient services for Medication Management, Partial Hospitalization Program, and NP will make Hydroxyzine 50mg  PRN avab for insomnia    , NP 11/14/2020, 4:42 PM

## 2021-01-02 ENCOUNTER — Emergency Department (HOSPITAL_COMMUNITY)
Admission: EM | Admit: 2021-01-02 | Discharge: 2021-01-03 | Disposition: A | Discharge status: Home / Self Care | Payer: No Typology Code available for payment source | Attending: Emergency Medicine | Admitting: Emergency Medicine

## 2021-01-02 ENCOUNTER — Other Ambulatory Visit: Payer: Self-pay

## 2021-01-02 ENCOUNTER — Encounter (HOSPITAL_COMMUNITY): Payer: Self-pay | Admitting: Emergency Medicine

## 2021-01-02 ENCOUNTER — Ambulatory Visit (HOSPITAL_COMMUNITY)
Admission: EM | Admit: 2021-01-02 | Discharge: 2021-01-02 | Disposition: A | Discharge status: Home / Self Care | Payer: No Typology Code available for payment source | Source: Ambulatory Visit | Attending: Emergency Medicine | Admitting: Emergency Medicine

## 2021-01-02 DIAGNOSIS — Z0441 Encounter for examination and observation following alleged adult rape: Secondary | ICD-10-CM | POA: Insufficient documentation

## 2021-01-02 DIAGNOSIS — T7421XA Adult sexual abuse, confirmed, initial encounter: Secondary | ICD-10-CM | POA: Insufficient documentation

## 2021-01-02 LAB — URINALYSIS, ROUTINE W REFLEX MICROSCOPIC
Bilirubin Urine: NEGATIVE
Glucose, UA: NEGATIVE mg/dL
Hgb urine dipstick: NEGATIVE
Ketones, ur: 20 mg/dL — AB
Nitrite: NEGATIVE
Protein, ur: NEGATIVE mg/dL
Specific Gravity, Urine: 1.028 (ref 1.005–1.030)
pH: 5 (ref 5.0–8.0)

## 2021-01-02 LAB — I-STAT BETA HCG BLOOD, ED (MC, WL, AP ONLY): I-stat hCG, quantitative: <5 m[IU]/mL (ref <5)

## 2021-01-02 LAB — HIV ANTIBODY (ROUTINE TESTING W REFLEX): HIV Screen 4th Generation wRfx: NONREACTIVE

## 2021-01-02 MED ORDER — DOXYCYCLINE HYCLATE 100 MG PO TABS: 100.0000 mg | ORAL_TABLET | Freq: Two times a day (BID) | ORAL | 0 refills | Status: DC

## 2021-01-02 MED ORDER — LIDOCAINE HCL (PF) 1 % IJ SOLN
1.0000 mL | Freq: Once | INTRAMUSCULAR | Status: DC
  Filled 2021-01-02: qty 5

## 2021-01-02 MED ORDER — CEFTRIAXONE SODIUM 500 MG IJ SOLR
500.0000 mg | Freq: Once | INTRAMUSCULAR | Status: DC
  Filled 2021-01-02: qty 500
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
## 2021-01-02 NOTE — ED Provider Notes (Signed)
  Provider Note MRN:  [id number]  Arrival date & time: 01/02/21    ED Course and Medical Decision Making  Assumed care from Dr. Stevie Kern at shift change.  Awaiting SANE exam, plan is for discharge.  Procedures  Final Clinical Impressions(s) / ED Diagnoses  No diagnosis found.  ED Discharge Orders          Ordered    doxycycline (VIBRA-TABS) 100 MG tablet  2 times daily        01/02/21 2323            Discharge Instructions   None     Elmer Sow. Pilar Plate, MD [facility] Emergency [facility] [email or web address]    Sabas Sous, MD 01/03/21 [phone]

## 2021-01-03 LAB — HEPATITIS PANEL, ACUTE
HCV Ab: NONREACTIVE
Hep A IgM: NONREACTIVE
Hep B C IgM: NONREACTIVE
Hepatitis B Surface Ag: NONREACTIVE

## 2021-01-03 LAB — RPR: RPR Ser Ql: NONREACTIVE
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
## 2021-01-17 ENCOUNTER — Encounter (HOSPITAL_BASED_OUTPATIENT_CLINIC_OR_DEPARTMENT_OTHER): Payer: Self-pay | Admitting: Emergency Medicine

## 2021-01-17 ENCOUNTER — Emergency Department (HOSPITAL_BASED_OUTPATIENT_CLINIC_OR_DEPARTMENT_OTHER)
Admission: EM | Admit: 2021-01-17 | Discharge: 2021-01-17 | Disposition: A | Discharge status: Home / Self Care | Payer: No Typology Code available for payment source | Attending: Emergency Medicine | Admitting: Emergency Medicine

## 2021-01-17 ENCOUNTER — Other Ambulatory Visit: Payer: Self-pay

## 2021-01-17 DIAGNOSIS — J069 Acute upper respiratory infection, unspecified: Secondary | ICD-10-CM | POA: Insufficient documentation

## 2021-01-17 DIAGNOSIS — R0981 Nasal congestion: Secondary | ICD-10-CM | POA: Diagnosis present

## 2021-01-17 DIAGNOSIS — B309 Viral conjunctivitis, unspecified: Secondary | ICD-10-CM | POA: Diagnosis not present

## 2021-01-17 NOTE — ED Provider Notes (Signed)
[facility] EMERGENCY DEPT Provider Note   CSN: [id number] Arrival date & time: 01/17/21  4098     History Chief Complaint  Patient presents with   Eye Problem    Whitney Castro is a 25 y.o. female.  HPI 25 year old female presents today with her 54-year-old with URI symptoms.  Patient has had some nasal congestion, right ear congestion, and right I was matted shut this morning.  She has not had fever.  She has had her COVID vaccines.  She denies any chest pain, dyspnea, abdominal pain, nausea, vomiting, diarrhea.  She states that she is not pregnant.  She has been taking over-the-counter medication     Past Medical History:  Diagnosis Date   Anxiety    B12 deficiency    Medical history non-contributory    Ovarian cyst    Vitamin D deficiency     Patient Active Problem List   Diagnosis Date Noted   Tenosynovitis of right wrist 11/05/2020   TSH elevation 09/17/2020   Encounter for general adult medical examination with abnormal findings 09/16/2020   Chronic fatigue 09/16/2020   Moderate episode of recurrent major depressive disorder ([facility]) 09/16/2020    Past Surgical History:  Procedure Laterality Date   APPENDECTOMY     LIGAMENT REPAIR     knee and elbow     OB History     Gravida  1   Para  1   Term  1   Preterm      AB      Living  1      SAB      IAB      Ectopic      Multiple  0   Live Births  1           Family History  Problem Relation Age of Onset   Healthy Mother     Social History   Tobacco Use   Smoking status: Never   Smokeless tobacco: Never  Vaping Use   Vaping Use: Never used  Substance Use Topics   Alcohol use: Not Currently   Drug use: Never    Home Medications Prior to Admission medications   Medication Sig Start Date End Date Taking? Authorizing Provider  doxycycline (VIBRA-TABS) 100 MG tablet Take 1 tablet (100 mg total) by mouth 2 (two) times daily. 01/02/21   Holley Dexter, MD  hydrOXYzine  (ATARAX/VISTARIL) 50 MG tablet Take 1 tablet (50 mg total) by mouth at bedtime and may repeat dose one time if needed. 11/14/20   Oneta Rack, NP  Prenatal Vit-Fe Fumarate-FA (MULTIVITAMIN-PRENATAL) 27-0.8 MG TABS tablet Take 1 tablet by mouth daily at 12 noon.    [provider]    Allergies    Patient has no known allergies.  Review of Systems   Review of Systems  All other systems reviewed and are negative.  Physical Exam Updated Vital Signs BP 114/70 (BP Location: Right Arm)   Pulse 99   Temp 98.1 F (36.7 C) (Oral)   Resp 18   SpO2 100%   Physical Exam Vitals and nursing note reviewed.  Constitutional:      General: She is not in acute distress.    Appearance: She is well-developed.  HENT:     Head: Normocephalic and atraumatic.     Right Ear: External ear normal.     Left Ear: Tympanic membrane and external ear normal.     Ears:     Comments: Some mild erythema of  right TM but no bulging and landmarks are in place    Nose: Nose normal.  Eyes:     Conjunctiva/sclera: Conjunctivae normal.     Pupils: Pupils are equal, round, and reactive to light.  Cardiovascular:     Rate and Rhythm: Normal rate and regular rhythm.  Pulmonary:     Effort: Pulmonary effort is normal.  Abdominal:     General: Abdomen is flat. Bowel sounds are normal.  Musculoskeletal:        General: Normal range of motion.     Cervical back: Normal range of motion and neck supple.  Skin:    General: Skin is warm and dry.  Neurological:     Mental Status: She is alert and oriented to person, place, and time.     Motor: No abnormal muscle tone.     Coordination: Coordination normal.  Psychiatric:        Behavior: Behavior normal.        Thought Content: Thought content normal.    ED Results / Procedures / Treatments   Labs (all labs ordered are listed, but only abnormal results are displayed) Labs Reviewed - No data to display  EKG None  Radiology No results  found.  Procedures Procedures   Medications Ordered in ED Medications - No data to display  ED Course  I have reviewed the triage vital signs and the nursing notes.  Pertinent labs & imaging results that were available during my care of the patient were reviewed by me and considered in my medical decision making (see chart for details).    MDM Rules/Calculators/A&P                           25 year old healthy female with viral URI consistent with her daughters similar presentation.  She does have some ear pain but feel this is more secondary to congestion does not appear to have acute bacterial otitis media.  Additionally the matting of the eye is likely conjunctivitis secondary to the nasal congestion discharge and viral syndrome.  We discussed upon return precautions and need for follow-up and she voices understanding. Final Clinical Impression(s) / ED Diagnoses Final diagnoses:  Viral URI  Conjunctivitis, viral    Rx / DC Orders ED Discharge Orders     None        Margarita Grizzle, MD 01/17/21 [id number]

## 2021-01-17 NOTE — ED Notes (Signed)
Pt dc home, stated understanding of dc instructions. 

## 2021-01-17 NOTE — ED Triage Notes (Signed)
Pt woke up this am with cough/congestion/ right eye draining and red. No fever

## 2021-01-20 ENCOUNTER — Other Ambulatory Visit (HOSPITAL_COMMUNITY): Payer: No Typology Code available for payment source | Attending: Psychiatry | Admitting: Psychiatry

## 2021-01-20 ENCOUNTER — Other Ambulatory Visit: Payer: Self-pay

## 2021-01-20 DIAGNOSIS — F332 Major depressive disorder, recurrent severe without psychotic features: Secondary | ICD-10-CM | POA: Insufficient documentation

## 2021-01-20 DIAGNOSIS — Z79899 Other long term (current) drug therapy: Secondary | ICD-10-CM | POA: Insufficient documentation

## 2021-01-20 DIAGNOSIS — R45851 Suicidal ideations: Secondary | ICD-10-CM | POA: Insufficient documentation

## 2021-01-20 DIAGNOSIS — Z91411 Personal history of adult psychological abuse: Secondary | ICD-10-CM | POA: Insufficient documentation

## 2021-01-20 DIAGNOSIS — F411 Generalized anxiety disorder: Secondary | ICD-10-CM | POA: Insufficient documentation

## 2021-01-20 DIAGNOSIS — Z91410 Personal history of adult physical and sexual abuse: Secondary | ICD-10-CM | POA: Insufficient documentation

## 2021-01-20 DIAGNOSIS — F4310 Post-traumatic stress disorder, unspecified: Secondary | ICD-10-CM | POA: Insufficient documentation
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
## 2021-01-21 ENCOUNTER — Other Ambulatory Visit: Payer: Self-pay

## 2021-01-21 ENCOUNTER — Encounter (HOSPITAL_COMMUNITY): Payer: Self-pay | Admitting: Psychiatry

## 2021-01-21 ENCOUNTER — Other Ambulatory Visit (HOSPITAL_COMMUNITY): Payer: No Typology Code available for payment source | Admitting: Professional

## 2021-01-21 DIAGNOSIS — F4310 Post-traumatic stress disorder, unspecified: Secondary | ICD-10-CM | POA: Diagnosis not present

## 2021-01-21 DIAGNOSIS — Z91411 Personal history of adult psychological abuse: Secondary | ICD-10-CM | POA: Diagnosis not present

## 2021-01-21 DIAGNOSIS — F411 Generalized anxiety disorder: Secondary | ICD-10-CM | POA: Diagnosis not present

## 2021-01-21 DIAGNOSIS — Z79899 Other long term (current) drug therapy: Secondary | ICD-10-CM | POA: Diagnosis not present

## 2021-01-21 DIAGNOSIS — R45851 Suicidal ideations: Secondary | ICD-10-CM | POA: Diagnosis not present

## 2021-01-21 DIAGNOSIS — Z91410 Personal history of adult physical and sexual abuse: Secondary | ICD-10-CM | POA: Diagnosis not present

## 2021-01-21 DIAGNOSIS — F332 Major depressive disorder, recurrent severe without psychotic features: Secondary | ICD-10-CM | POA: Diagnosis present

## 2021-01-21 MED ORDER — MIRTAZAPINE 7.5 MG PO TABS: 7.5000 mg | ORAL_TABLET | Freq: Every day | ORAL | 0 refills | Status: AC
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
## 2021-01-22 ENCOUNTER — Other Ambulatory Visit: Payer: Self-pay

## 2021-01-22 ENCOUNTER — Other Ambulatory Visit (HOSPITAL_COMMUNITY): Payer: No Typology Code available for payment source

## 2021-01-25 ENCOUNTER — Other Ambulatory Visit: Payer: Self-pay

## 2021-01-25 ENCOUNTER — Other Ambulatory Visit (HOSPITAL_COMMUNITY): Payer: No Typology Code available for payment source | Admitting: Psychiatry

## 2021-01-25 DIAGNOSIS — F332 Major depressive disorder, recurrent severe without psychotic features: Secondary | ICD-10-CM

## 2021-01-25 NOTE — Progress Notes (Signed)
Virtual Visit via Video Note   I connected with Whitney Castro  on 01/21/21 at  9:00 AM EDT by a video enabled telemedicine application and verified that I am speaking with the correct person using two identifiers.   At orientation to the IOP program, Case Manager discussed the limitations of evaluation and management by telemedicine and the availability of in person appointments. The patient expressed understanding and agreed to proceed with virtual visits throughout the duration of the program.   Location:  Patient: Patient Home Provider: Clinical Home Office   History of Present Illness: Depression and Anxiety   Observations/Objective: Check In: Case Manager checked in with all participants to review discharge dates, insurance authorizations, work-related documents and needs from the treatment team regarding medications. Client stated needs and engaged in discussion.    Initial Therapeutic Activity: Counselor facilitated a check-in with group members to assess mood and current functioning. Client shared details of their mental health management since our last session, including challenges and successes. Client arrived within time allowed and reports that she is feeling "not good." Client rates her mood at a 3 on a scale of 1-10 with 10 being great. Client presents with depression and anxiety. Client denied any current SI/HI. Client reports sleep and appetite are "not great." Client reports anxiety related to court with her daughter's father tomorrow. Client able to process.   Second Therapeutic Activity: Counselor facilitated group on distress tolerance skills. Group discussed "STOP" and "TIP" skills and how/when clients can employ these methods to help.  Clients identified when these techniques may be helpful in their personal lives. Client able to participate and process.    Check Out: Counselor closed program and prompted group members to identify one self-care practice or productivity  activity they would like to engage in today. Client endorsed safety plan to be followed to prevent safety issues.   Assessment and Plan: Clinician recommends that Client remain in IOP treatment to better manage mental health symptoms, stabilization and to address treatment plan goals. Clinician recommends adherence to crisis/safety plan, taking medications as prescribed, and following up with medical professionals if any issues arise.     Follow Up Instructions: Clinician will send Webex link for next session. The Client was advised to call back or seek an in-person evaluation if the symptoms worsen or if the condition fails to improve as anticipated.     I provided 180 minutes of non-face-to-face time during this encounter.  Whitney Castro, [facility] 01/21/21

## 2021-01-26 ENCOUNTER — Other Ambulatory Visit: Payer: Self-pay

## 2021-01-26 ENCOUNTER — Other Ambulatory Visit (HOSPITAL_COMMUNITY): Payer: No Typology Code available for payment source | Admitting: Psychiatry

## 2021-01-26 DIAGNOSIS — F332 Major depressive disorder, recurrent severe without psychotic features: Secondary | ICD-10-CM | POA: Diagnosis not present

## 2021-01-27 ENCOUNTER — Encounter (HOSPITAL_COMMUNITY): Payer: Self-pay

## 2021-01-27 ENCOUNTER — Other Ambulatory Visit: Payer: Self-pay

## 2021-01-27 ENCOUNTER — Other Ambulatory Visit (HOSPITAL_COMMUNITY): Payer: No Typology Code available for payment source | Admitting: Professional

## 2021-01-27 DIAGNOSIS — F332 Major depressive disorder, recurrent severe without psychotic features: Secondary | ICD-10-CM | POA: Diagnosis not present

## 2021-01-27 NOTE — Progress Notes (Signed)
Virtual Visit via Video Note  I connected with Whitney Castro on 01/27/21 at  9:00 AM EDT by a video enabled telemedicine application and verified that I am speaking with the correct person using two identifiers.  At orientation to the IOP program, Case Manager discussed the limitations of evaluation and management by telemedicine and the availability of in person appointments. The patient expressed understanding and agreed to proceed with virtual visits throughout the duration of the program.   Location:  Patient: Patient Home Provider: Home Office   History of Present Illness: MDD  Observations/Objective: Check In: Case Manager checked in with all participants to review discharge dates, insurance authorizations, work-related documents and needs from the treatment team regarding medications. Client stated needs and engaged in discussion.   Initial Therapeutic Activity: Counselor facilitated a check-in with group members to assess mood and current functioning. Client shared details of their mental health management since our last session, including challenges and successes. Counselor engaged group in discussion, covering the following topics: discharge planning, follow up with appropriate providers and programs, applying skills to reduce avoidant behaviors and assertive communication of basic and therapeutic needs. Client presents with moderate depression and moderate anxiety. Client denied any current SI/HI/psychosis.  Second Therapeutic Activity: Counselor prompted group members to identify challenges in managing mental health in the workplace. Group members shared unique and global issues they face. Counselor provided strategies for stress management in the workplace through two videos created by a MD and a Dr of Psychology. Group Members identified which strategies they plan to implement to achieve better work-life balance and in prevention of mental health breakdowns due to work-related  stressors.   Check Out: Counselor prompted group members to identify one self-care practice or productivity activity they would like to engage in today. Client endorsed safety plan to be followed to prevent safety issues. Assessment and Plan: Clinician recommends that Client remain in IOP treatment to better manage mental health symptoms, stabilization and to address treatment plan goals. Clinician recommends adherence to crisis/safety plan, taking medications as prescribed, and following up with medical professionals if any issues arise.    Follow Up Instructions: Clinician will send Webex link for next session. The Client was advised to call back or seek an in-person evaluation if the symptoms worsen or if the condition fails to improve as anticipated.     I provided 180 minutes of non-face-to-face time during this encounter.     Hilbert Odor, LCSW

## 2021-01-27 NOTE — Progress Notes (Signed)
Virtual Visit via Video Note  I connected with Whitney Castro on 01/25/21 at  9:00 AM EDT by a video enabled telemedicine application and verified that I am speaking with the correct person using two identifiers.   At orientation to the IOP program, Case Manager discussed the limitations of evaluation and management by telemedicine and the availability of in person appointments. The patient expressed understanding and agreed to proceed with virtual visits throughout the duration of the program.   Location:  Patient: Patient Home Provider: Home Office   History of Present Illness: MDD   Observations/Objective: Check In: Case Manager checked in with all participants to review discharge dates, insurance authorizations, work-related documents and needs from the treatment team regarding medications. Client stated needs and engaged in discussion.    Initial Therapeutic Activity: Counselor facilitated a check-in with group members to assess mood and current functioning. Client shared details of their mental health management since our last session, including challenges and successes. Counselor engaged group in discussion, covering the following topics: recreational activities for improving socialization and connections, community resources for various mental health support groups and programs, and top 3 life stressors and strategies for addressing issues. Client presents with moderate depression and moderate anxiety. Client denied any current SI/HI/psychosis.   Second Therapeutic Activity: Counselor prompted group members to compare and contrast their anxiety symptoms vs depressive symptoms. Counselor allowed time for reflection and for group to report out, discussing thoughts, triggers, physical symptoms, emotions and functioning. Counselor shared psychoeducation on the anxiety cycle and depressive cycle highlighting avoidance as a factor in increasing symptoms. Counselor shared a video from [place],  LMFT, Therapy in a Nutshell about addressing avoidance. Group members took note of takeaways and we discusses application skills.       Check Out: Counselor prompted group members to identify one self-care practice or productivity activity they would like to engage in today. Client endorsed safety plan to be followed to prevent safety issues.   Assessment and Plan: Clinician recommends that Client remain in IOP treatment to better manage mental health symptoms, stabilization and to address treatment plan goals. Clinician recommends adherence to crisis/safety plan, taking medications as prescribed, and following up with medical professionals if any issues arise.     Follow Up Instructions: Clinician will send Webex link for next session. The Client was advised to call back or seek an in-person evaluation if the symptoms worsen or if the condition fails to improve as anticipated.     I provided 180 minutes of non-face-to-face time during this encounter.     Hilbert Odor, LCSW

## 2021-01-28 ENCOUNTER — Other Ambulatory Visit (HOSPITAL_COMMUNITY): Payer: No Typology Code available for payment source | Admitting: Psychiatry

## 2021-01-28 ENCOUNTER — Other Ambulatory Visit: Payer: Self-pay

## 2021-01-28 NOTE — Patient Instructions (Signed)
D:  Pt will complete MH-IOP on 01-29-21.  A:  Follow up with PCP and [name] (therapist).  Return to work on 02-01-21; without any restrictions.  R:  Patient receptive.

## 2021-01-29 ENCOUNTER — Other Ambulatory Visit (HOSPITAL_COMMUNITY): Payer: Self-pay | Admitting: Family

## 2021-01-29 ENCOUNTER — Other Ambulatory Visit: Payer: Self-pay

## 2021-01-29 ENCOUNTER — Other Ambulatory Visit (HOSPITAL_COMMUNITY): Payer: No Typology Code available for payment source | Admitting: Psychiatry

## 2021-01-29 ENCOUNTER — Encounter (HOSPITAL_COMMUNITY): Payer: Self-pay | Admitting: Family

## 2021-01-29 DIAGNOSIS — F332 Major depressive disorder, recurrent severe without psychotic features: Secondary | ICD-10-CM | POA: Diagnosis not present

## 2021-01-29 MED ORDER — SERTRALINE HCL 25 MG PO TABS: 25.0000 mg | ORAL_TABLET | Freq: Every day | ORAL | 0 refills | Status: AC

## 2021-01-29 NOTE — Progress Notes (Signed)
Virtual Visit via Video Note  I connected with Whitney Castro on 01/29/21 at  9:00 AM EDT by a video enabled telemedicine application and verified that I am speaking with the correct person using two identifiers.  At orientation to the IOP program, Case Manager discussed the limitations of evaluation and management by telemedicine and the availability of in person appointments. The patient expressed understanding and agreed to proceed with virtual visits throughout the duration of the program.   Location:  Patient: Patient Home Provider: Home Office   History of Present Illness: MDD  Observations/Objective: Check In: Case Manager checked in with all participants to review discharge dates, insurance authorizations, work-related documents and needs from the treatment team regarding medications. Client stated needs and engaged in discussion.   Initial Therapeutic Activity: Counselor facilitated a check-in with group members to assess mood and current functioning. Client shared details of their mental health management, including challenges and successes. Client arrived within time allowed and reports that she is feeling "OK." Client rates her mood at a 6 on a scale of 1-10 with 10 being great. Counselor engaged group in discussion, covering the following topics: codependence, being optimistic, and accepting opportunities as they arise. Client presents with depression and anxiety. Client denied any current SI/HI. Client reports sleep and appetite are good.  Second Therapeutic Activity: Counselor introduced the cognitive distortions. Group discussed common cognitive distortions and how to "catch, challenge, change". Group participate in an activity to identify cognitive distortions and which challenges to use.   Check Out: Counselor prompted group members to identify one self-care practice or productivity activity they would like to engage in today. Client endorsed safety plan to be followed to prevent  safety issues.  Assessment and Plan: Clinician recommends that Client discharge from IOP and step down to individual providers. Clinician recommends adherence to crisis/safety plan, taking medications as prescribed, and following up with medical professionals if any issues arise.    Follow Up Instructions: Clinician will send Webex link for next session. The Client was advised to call back or seek an in-person evaluation if the symptoms worsen or if the condition fails to improve as anticipated.     I provided 180 minutes of non-face-to-face time during this encounter.     Milana Na, LCMHCA, LCASA
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
## 2021-01-29 NOTE — Progress Notes (Signed)
Virtual Visit via Video Note  I connected with Franz Dell on 01/29/21 at  9:00 AM EDT by a video enabled telemedicine application and verified that I am speaking with the correct person using two identifiers.  Location: Patient: home  Provider: Office    I discussed the limitations of evaluation and management by telemedicine and the availability of in person appointments. The patient expressed understanding and agreed to proceed.    I discussed the assessment and treatment plan with the patient. The patient was provided an opportunity to ask questions and all were answered. The patient agreed with the plan and demonstrated an understanding of the instructions.   The patient was advised to call back or seek an in-person evaluation if the symptoms worsen or if the condition fails to improve as anticipated.  I provided 15 minutes of non-face-to-face time during this encounter.   Oneta Rack, NP   [facility] Intensive Outpatient Program Discharge Summary  Verbie Babic [id number]  Admission date: 01/20/2021 Discharge date: 01/29/2021  Reason for admission: per admission assessment note:  Whitney Castro is a 25 y.o. female.  Presents to [facility] urgent care after she states she was referred by her counselor. Reported she has been followed by  employee assistance Program (E[employer]).  States history with postpartum depression and she  feels as her depression is getting worse. Stated that she is not able to sleep well at night.  She is currently denying suicidal or homicidal ideations.  Denies auditory or visual hallucinations.     Does report suicidal ideations about 1 year prior.  Denied history with plan or intent.  States she was initiated on Zoloft and is currently taken Zoloft 100 mg daily. Reported taking and tolerating medications well however feels that she needs something more to help with her depressive symptoms.   Progress in Program Toward Treatment Goals:  Ongoing, Tarren was seen and evaluated via WebEx.  She is denying suicidal or homicidal ideations.  Denies auditory or visual hallucinations.  Reports overall her mood has improved since attending intensive outpatient programming however reports she needs to get back to work due to financial concerns.  Continue Zoloft titration.  We will make 25 mg available x7 days.  Patient to continue Remeron 7.5 mg p.o. nightly.  She reports frequent nightmares.  Discussed follow-up with new care provider and to consider Minipress if nightmares continues.  Patient was receptive to plan.  Support, encouragement and  reassurance was provided.  Progress (rationale):  Keep follow up with therapist [name] and PCP until patient is able to make outpatient appointment with psychiatry.    Take all medications as prescribed. Keep all follow-up appointments as scheduled.  Do not consume alcohol or use illegal drugs while on prescription medications. Report any adverse effects from your medications to your primary care provider promptly.  In the event of recurrent symptoms or worsening symptoms, call 911, a crisis hotline, or go to the nearest emergency department for evaluation.    Oneta Rack, NP 01/29/2021

## 2021-01-29 NOTE — Progress Notes (Signed)
Virtual Visit via Video Note  I connected with Whitney Castro on 01/27/21 at  9:00 AM EDT by a video enabled telemedicine application and verified that I am speaking with the correct person using two identifiers.  At orientation to the IOP program, Case Manager discussed the limitations of evaluation and management by telemedicine and the availability of in person appointments. The patient expressed understanding and agreed to proceed with virtual visits throughout the duration of the program.   Location:  Patient: Patient Home Provider: Home Office   History of Present Illness: MDD  Observations/Objective: Check In: Case Manager checked in with all participants to review discharge dates, insurance authorizations, work-related documents and needs from the treatment team regarding medications. Client stated needs and engaged in discussion.   Initial Therapeutic Activity: Counselor facilitated a check-in with group members to assess mood and current functioning. Client shared details of their mental health management, including challenges and successes. Client arrived within time allowed and reports that she is feeling "OK." Client rates her mood at a 6 on a scale of 1-10 with 10 being great. Counselor engaged group in discussion, covering the following topics: rejection, stigma related to mental health, self-care, and dating while focusing on mental health journey. Client presents with depression and anxiety. Client denied any current SI/HI. Client reports sleep is a problem due to nightmares and appetite is increased.    Second Therapeutic Activity: Counselor introduced Burnis Kingfisher, [employer] [facility] to present information and discussion on strength. Group members engaged in discussion, sharing how strength looks to them and where the definition of strength comes from.  Check Out: Counselor prompted group members to identify one self-care practice or productivity activity they would like to engage  in today. Client endorsed safety plan to be followed to prevent safety issues. Assessment and Plan: Clinician recommends that Client remain in IOP treatment to better manage mental health symptoms, stabilization and to address treatment plan goals. Clinician recommends adherence to crisis/safety plan, taking medications as prescribed, and following up with medical professionals if any issues arise.    Follow Up Instructions: Clinician will send Webex link for next session. The Client was advised to call back or seek an in-person evaluation if the symptoms worsen or if the condition fails to improve as anticipated.     I provided 180 minutes of non-face-to-face time during this encounter.     Whitney Castro, LCMHCA, LCASA

## 2021-01-31 ENCOUNTER — Encounter (HOSPITAL_COMMUNITY): Payer: Self-pay | Admitting: Emergency Medicine

## 2021-01-31 ENCOUNTER — Ambulatory Visit (HOSPITAL_COMMUNITY)
Admission: EM | Admit: 2021-01-31 | Discharge: 2021-01-31 | Disposition: A | Discharge status: Home / Self Care | Payer: No Typology Code available for payment source

## 2021-01-31 ENCOUNTER — Other Ambulatory Visit: Payer: Self-pay

## 2021-01-31 DIAGNOSIS — J0140 Acute pansinusitis, unspecified: Secondary | ICD-10-CM | POA: Diagnosis not present

## 2021-01-31 MED ORDER — AMOXICILLIN-POT CLAVULANATE 875-125 MG PO TABS: 1.0000 | ORAL_TABLET | Freq: Two times a day (BID) | ORAL | 0 refills | Status: AC

## 2021-01-31 MED ORDER — FLUCONAZOLE 150 MG PO TABS: 150.0000 mg | ORAL_TABLET | Freq: Once | ORAL | 0 refills | Status: AC

## 2021-01-31 NOTE — ED Triage Notes (Signed)
Pt was seen 2 weeks ago at [facility] for cold sxs advised it was viral but she is not any better. Pt c/o sore throat, right ear pain and bilateral eye discharge.

## 2021-01-31 NOTE — Discharge Instructions (Addendum)
Take antibiotic as prescribed. Follow up with any further concerns.  

## 2021-01-31 NOTE — ED Provider Notes (Signed)
[facility]    CSN: [id number] Arrival date & time: 01/31/21  1612      History   Chief Complaint Chief Complaint  Patient presents with   Sore Throat   Eye Drainage    HPI Whitney Castro is a 25 y.o. female.   Patient here today for evaluation of sinus congestion, drainage, sore throat, and eye irritation that has been ongoing for 2 weeks.  She reports that she was seen previously and was told it was likely viral in etiology.  Her cough has improved somewhat, but other symptoms have persisted.  She has tried over-the-counter treatment without significant relief.  She denies any known fever.  She has not had any abdominal pain, nausea, vomiting.   Sore Throat Pertinent negatives include no abdominal pain and no shortness of breath.   Past Medical History:  Diagnosis Date   Anxiety    B12 deficiency    Medical history non-contributory    Ovarian cyst    Vitamin D deficiency     Patient Active Problem List   Diagnosis Date Noted   Major depressive disorder, recurrent episode, severe (HCC) 01/21/2021   Tenosynovitis of right wrist 11/05/2020   TSH elevation 09/17/2020   Encounter for general adult medical examination with abnormal findings 09/16/2020   Chronic fatigue 09/16/2020   Moderate episode of recurrent major depressive disorder (HCC) 09/16/2020    Past Surgical History:  Procedure Laterality Date   APPENDECTOMY     LIGAMENT REPAIR     knee and elbow    OB History     Gravida  1   Para  1   Term  1   Preterm      AB      Living  1      SAB      IAB      Ectopic      Multiple  0   Live Births  1            Home Medications    Prior to Admission medications   Medication Sig Start Date End Date Taking? Authorizing Provider  amoxicillin-clavulanate (AUGMENTIN) 875-125 MG tablet Take 1 tablet by mouth every 12 (twelve) hours. 01/31/21  Yes Tomi Bamberger, PA-C  fluconazole (DIFLUCAN) 150 MG tablet Take 1 tablet (150 mg  total) by mouth once for 1 dose. 01/31/21 01/31/21 Yes Tomi Bamberger, PA-C  hydrOXYzine (ATARAX/VISTARIL) 50 MG tablet Take 1 tablet (50 mg total) by mouth at bedtime and may repeat dose one time if needed. 11/14/20  Yes Oneta Rack, NP  levonorgestrel (KYLEENA) 19.5 MG IUD 1 each by Intrauterine route once.   Yes [provider]  mirtazapine (REMERON) 7.5 MG tablet Take 1 tablet (7.5 mg total) by mouth at bedtime. 01/21/21  Yes Oneta Rack, NP  Prenatal Vit-Fe Fumarate-FA (MULTIVITAMIN-PRENATAL) 27-0.8 MG TABS tablet Take 1 tablet by mouth daily at 12 noon.   Yes [provider]  sertraline (ZOLOFT) 25 MG tablet Take 1 tablet (25 mg total) by mouth daily. 01/29/21 01/29/22 Yes Oneta Rack, NP    Family History Family History  Problem Relation Age of Onset   Healthy Mother     Social History Social History   Tobacco Use   Smoking status: Never   Smokeless tobacco: Never  Vaping Use   Vaping Use: Never used  Substance Use Topics   Alcohol use: Not Currently   Drug use: Never     Allergies  Patient has no known allergies.   Review of Systems Review of Systems  Constitutional:  Negative for chills and fever.  HENT:  Positive for congestion, ear pain (right ear pain with blowing nose), sinus pressure and sore throat.   Eyes:  Negative for discharge and redness.  Respiratory:  Negative for cough, shortness of breath and wheezing.   Gastrointestinal:  Negative for abdominal pain, diarrhea, nausea and vomiting.    Physical Exam Triage Vital Signs ED Triage Vitals  Enc Vitals Group     BP 01/31/21 1644 111/75     Pulse Rate 01/31/21 1644 87     Resp --      Temp 01/31/21 1644 99.5 F (37.5 C)     Temp Source 01/31/21 1644 Oral     SpO2 01/31/21 1644 98 %     Weight --      Height --      Head Circumference --      Peak Flow --      Pain Score 01/31/21 1642 9     Pain Loc --      Pain Edu? --      Excl. in GC? --    No data  found.  Updated Vital Signs BP 111/75 (BP Location: Right Arm)   Pulse 87   Temp 99.5 F (37.5 C) (Oral)   SpO2 98%     Physical Exam Vitals and nursing note reviewed.  Constitutional:      General: She is not in acute distress.    Appearance: Normal appearance. She is not ill-appearing.  HENT:     Head: Normocephalic and atraumatic.     Right Ear: Tympanic membrane normal.     Left Ear: Tympanic membrane normal.     Nose: Congestion present.     Mouth/Throat:     Mouth: Mucous membranes are moist.     Pharynx: No oropharyngeal exudate or posterior oropharyngeal erythema.     Comments: PND noted Eyes:     Conjunctiva/sclera: Conjunctivae normal.  Cardiovascular:     Rate and Rhythm: Normal rate and regular rhythm.     Heart sounds: Normal heart sounds. No murmur heard. Pulmonary:     Effort: Pulmonary effort is normal. No respiratory distress.     Breath sounds: Normal breath sounds. No wheezing, rhonchi or rales.  Skin:    General: Skin is warm and dry.  Neurological:     Mental Status: She is alert.  Psychiatric:        Mood and Affect: Mood normal.        Thought Content: Thought content normal.     UC Treatments / Results  Labs (all labs ordered are listed, but only abnormal results are displayed) Labs Reviewed - No data to display  EKG   Radiology No results found.  Procedures Procedures (including critical care time)  Medications Ordered in UC Medications - No data to display  Initial Impression / Assessment and Plan / UC Course  I have reviewed the triage vital signs and the nursing notes.  Pertinent labs & imaging results that were available during my care of the patient were reviewed by me and considered in my medical decision making (see chart for details).  Given duration of symptoms will treat to cover sinusitis.  Recommended follow-up with any further concerns or if symptoms fail to improve.  Patient requests Diflucan given frequent yeast  infections after antibiotic therapy, Diflucan sent to pharmacy as requested.  Final Clinical Impressions(s) /  UC Diagnoses   Final diagnoses:  Acute pansinusitis, recurrence not specified     Discharge Instructions      Take antibiotic as prescribed. Follow up with any further concerns.      ED Prescriptions     Medication Sig Dispense Auth. Provider   amoxicillin-clavulanate (AUGMENTIN) 875-125 MG tablet Take 1 tablet by mouth every 12 (twelve) hours. 14 tablet Erma Pinto F, PA-C   fluconazole (DIFLUCAN) 150 MG tablet Take 1 tablet (150 mg total) by mouth once for 1 dose. 1 tablet Tomi Bamberger, PA-C      PDMP not reviewed this encounter.   Tomi Bamberger, PA-C 01/31/21 1735

## 2021-02-01 ENCOUNTER — Other Ambulatory Visit (HOSPITAL_COMMUNITY): Payer: No Typology Code available for payment source

## 2021-02-01 ENCOUNTER — Other Ambulatory Visit (HOSPITAL_COMMUNITY): Payer: Self-pay | Admitting: Family

## 2021-07-15 IMAGING — DX DG CHEST 2V
2 series · 2 of 2 positions shown · non-contrast
Comparison: None.

CLINICAL DATA: Chest pain, shortness of breath

EXAM:
CHEST - 2 VIEW

[chest pa]
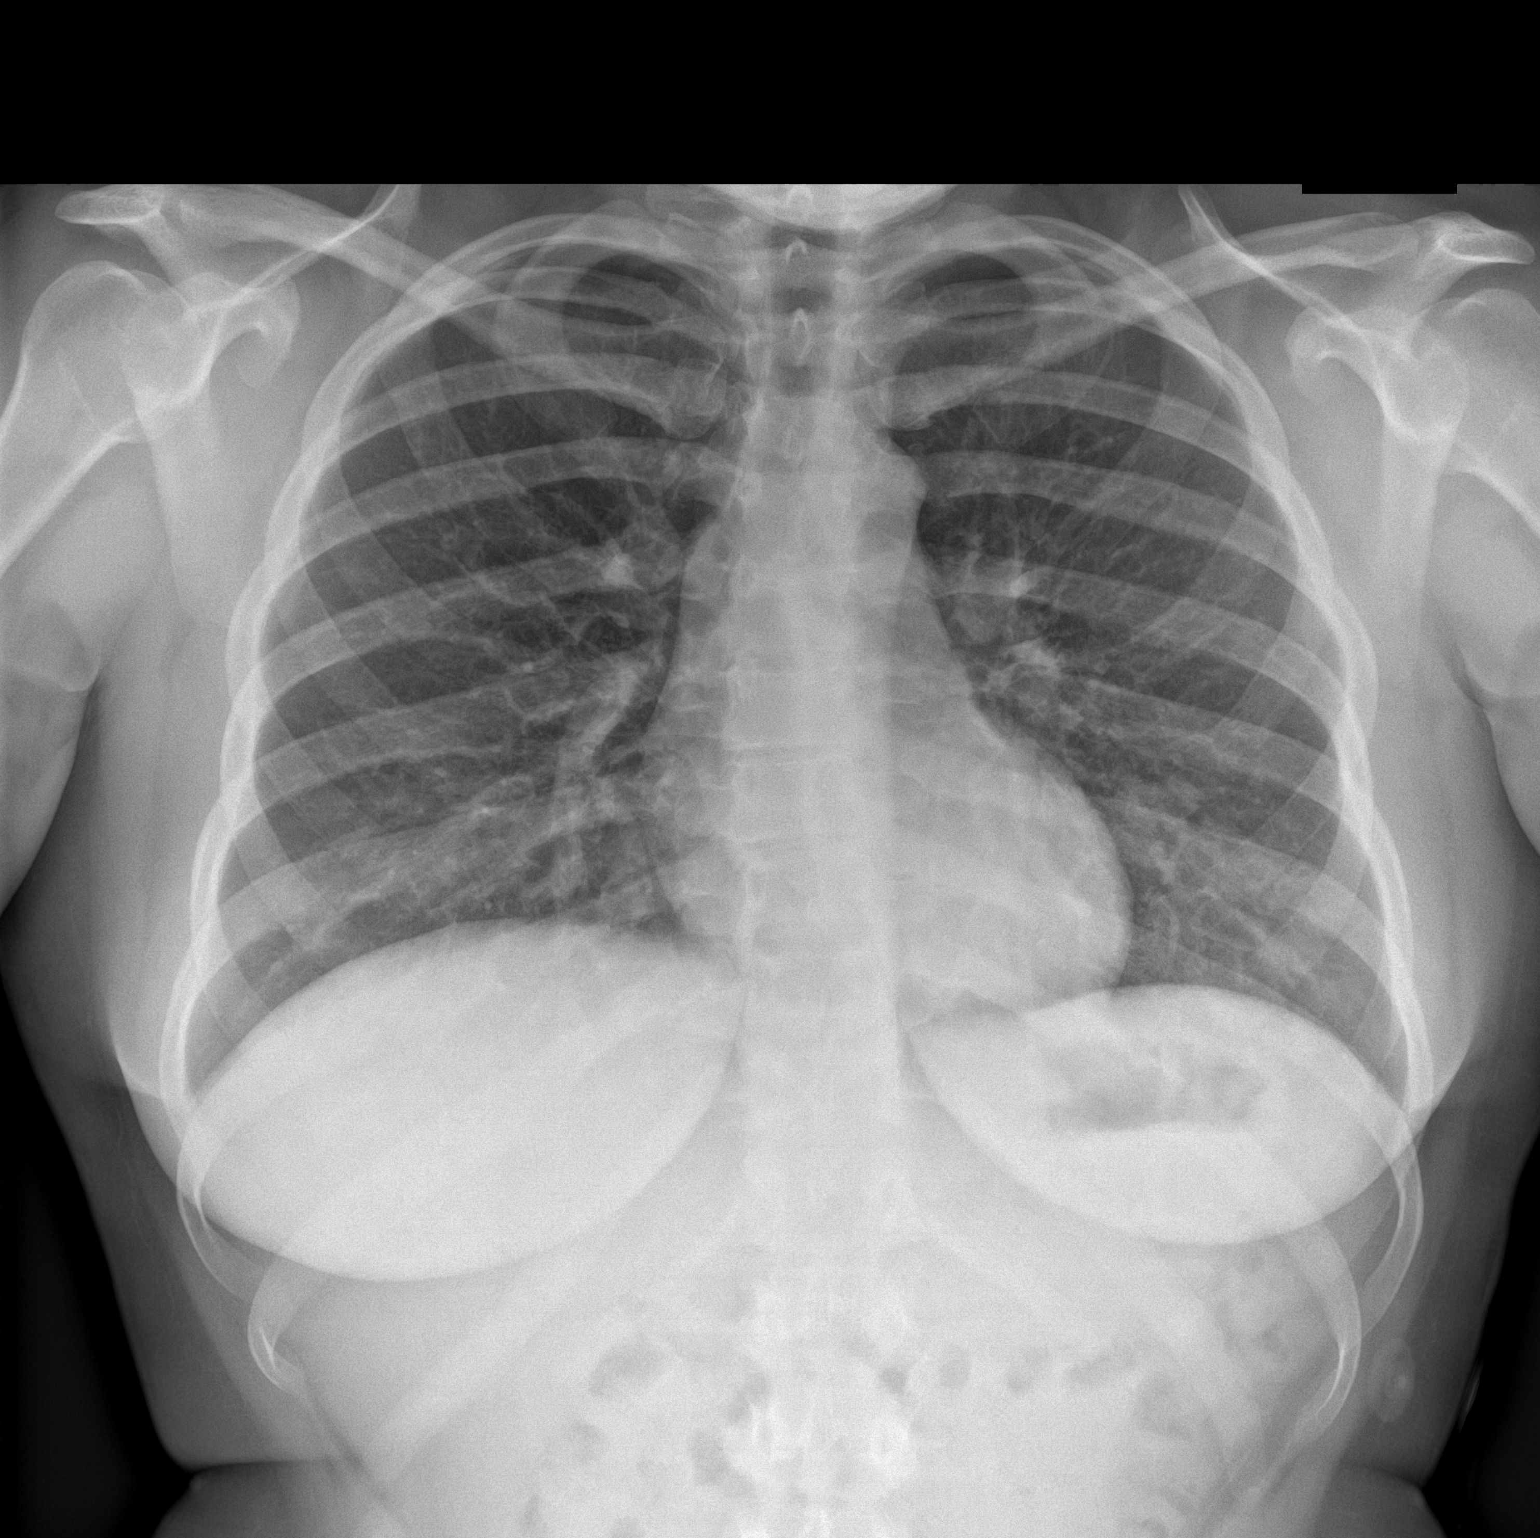

[chest lat]
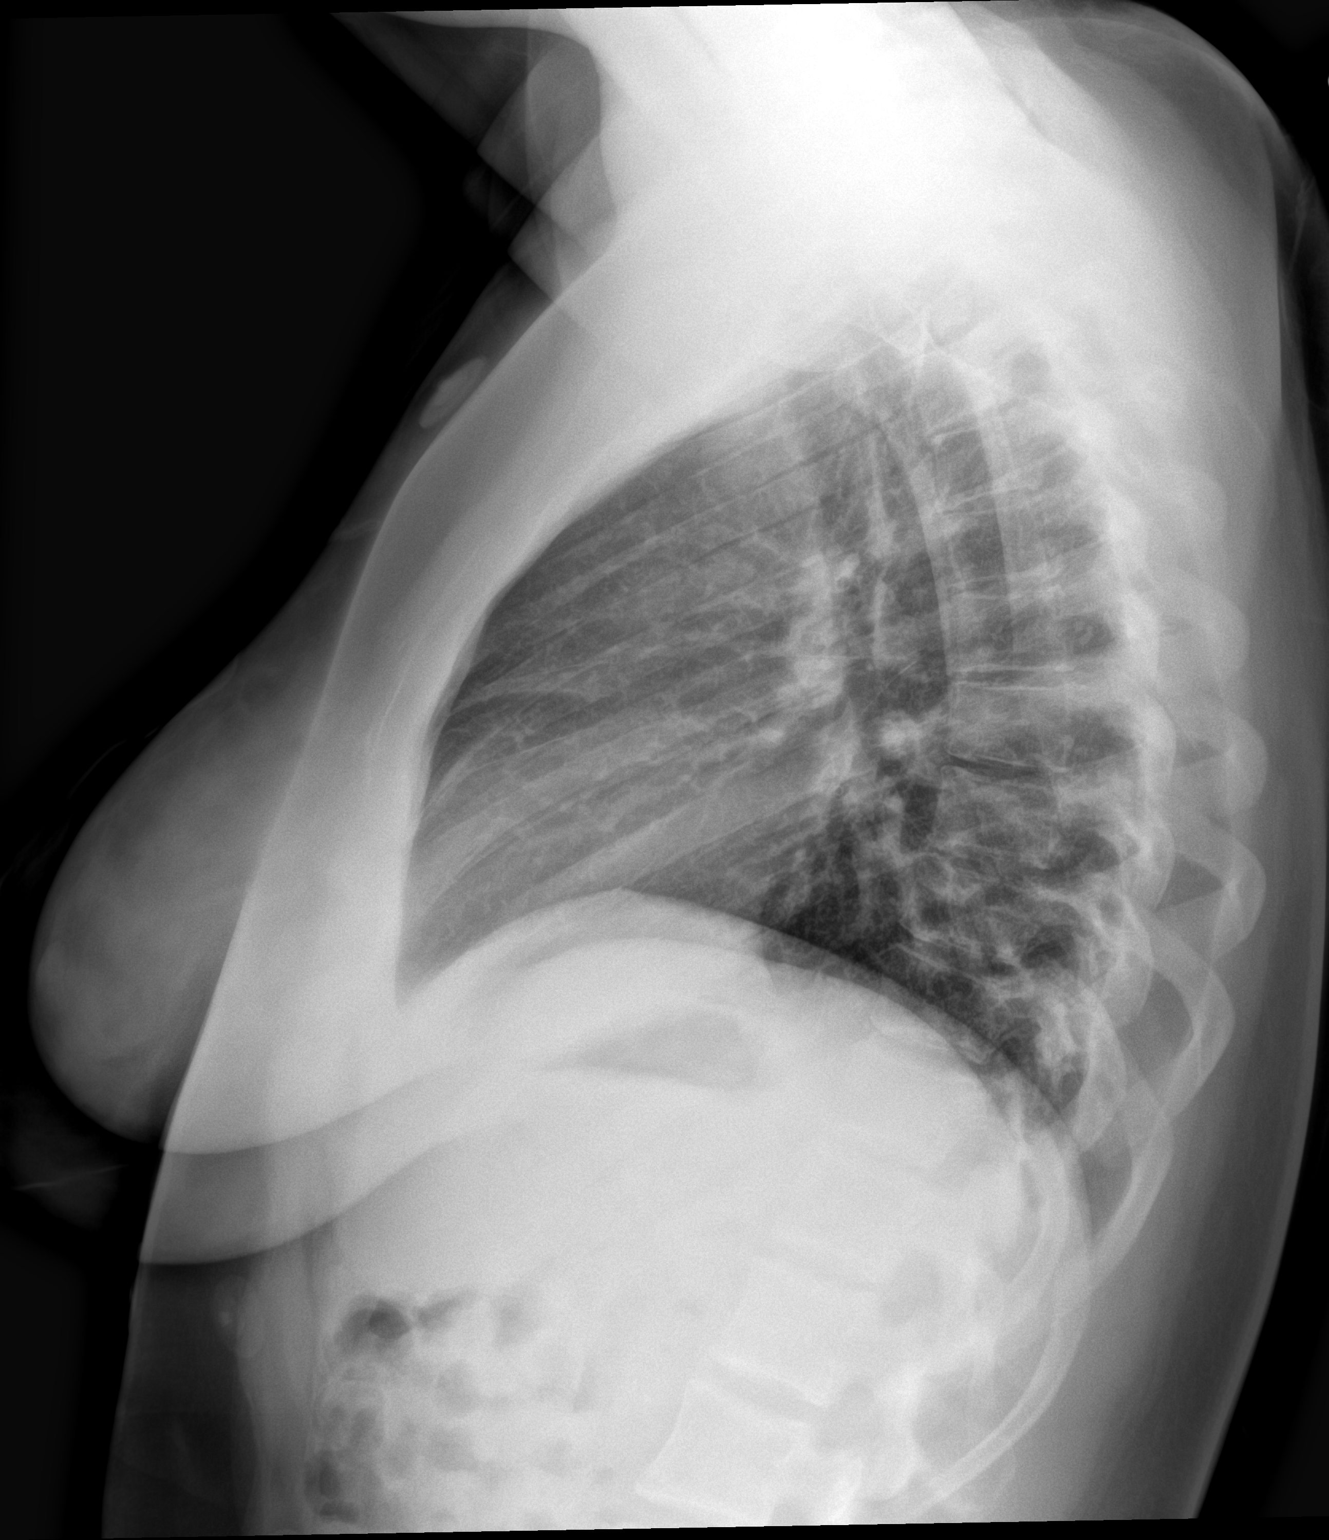

[2 of 2 positions shown; findings below may reference images not displayed]

FINDINGS: Heart and mediastinal contours are within normal limits. No focal
opacities or effusions. No acute bony abnormality.
IMPRESSION: No active cardiopulmonary disease.
